# Patient Record
Sex: Male | Born: 1975 | Race: White | Hispanic: No | Marital: Married | Smoking: Never smoker
Health system: Southern US, Community
[De-identification: ages and names within clinical notes are randomized; demographics above are authoritative.]

## PROBLEM LIST (undated history)

## (undated) HISTORY — PX: NOSE SURGERY: SHX723

## (undated) HISTORY — PX: TONSILLECTOMY: SUR1361

---

## 2006-09-03 ENCOUNTER — Ambulatory Visit: Payer: Self-pay | Admitting: Emergency Medicine

## 2012-05-25 ENCOUNTER — Ambulatory Visit: Payer: Self-pay | Admitting: General Practice

## 2014-09-02 ENCOUNTER — Inpatient Hospital Stay: Admission: RE | Admit: 2014-09-02 | Payer: Self-pay | Source: Ambulatory Visit

## 2014-09-04 ENCOUNTER — Encounter: Payer: Self-pay | Admitting: *Deleted

## 2014-09-04 NOTE — Patient Instructions (Signed)
  Your procedure is scheduled on: 09-05-14 Report to [facility] 2ND FLOOR To find out your arrival time please call [phone] between 1PM - 3PM on 09-04-14  Remember: Instructions that are not followed completely may result in serious medical risk, up to and including death, or upon the discretion of your surgeon and anesthesiologist your surgery may need to be rescheduled.    _X___ 1. Do not eat food or drink liquids after midnight. No gum chewing or hard candies.     _X___ 2. No Alcohol for 24 hours before or after surgery.   ____ 3. Bring all medications with you on the day of surgery if instructed.    ____ 4. Notify your doctor if there is any change in your medical condition     (cold, fever, infections).     Do not wear jewelry, make-up, hairpins, clips or nail polish.  Do not wear lotions, powders, or perfumes. You may wear deodorant.  Do not shave 48 hours prior to surgery. Men may shave face and neck.  Do not bring valuables to the hospital.    [facility] is not responsible for any belongings or valuables.               Contacts, dentures or bridgework may not be worn into surgery.  Leave your suitcase in the car. After surgery it may be brought to your room.  For patients admitted to the hospital, discharge time is determined by your  treatment team.   Patients discharged the day of surgery will not be allowed to drive home.   Please read over the following fact sheets that you were given:      ____ Take these medicines the morning of surgery with A SIP OF WATER:    1. NONE  2.   3.   4.  5.  6.  ____ Fleet Enema (as directed)   ____ Use CHG Soap as directed  ____ Use inhalers on the day of surgery  ____ Stop metformin 2 days prior to surgery    ____ Take 1/2 of usual insulin dose the night before surgery and none on the morning of surgery.   ____ Stop Coumadin/Plavix/aspirin-N/A  ____ Stop Anti-inflammatories-NO NSAIDS OR ASA  PRODUCTS-TYLENOL OK   ____ Stop supplements until after surgery.    ____ Bring C-Pap to the hospital.

## 2014-09-05 ENCOUNTER — Encounter: Payer: Self-pay | Admitting: *Deleted

## 2014-09-05 ENCOUNTER — Ambulatory Visit
Admission: RE | Admit: 2014-09-05 | Discharge: 2014-09-05 | Disposition: A | Discharge status: Home / Self Care | Payer: PRIVATE HEALTH INSURANCE | Source: Ambulatory Visit | Attending: Specialist | Admitting: Specialist

## 2014-09-05 ENCOUNTER — Ambulatory Visit: Payer: PRIVATE HEALTH INSURANCE | Admitting: Anesthesiology

## 2014-09-05 ENCOUNTER — Encounter
Admission: RE | Disposition: A | Discharge status: Home / Self Care | Payer: Self-pay | Source: Ambulatory Visit | Attending: Specialist

## 2014-09-05 DIAGNOSIS — G5602 Carpal tunnel syndrome, left upper limb: Secondary | ICD-10-CM | POA: Insufficient documentation

## 2014-09-05 DIAGNOSIS — Z885 Allergy status to narcotic agent status: Secondary | ICD-10-CM | POA: Diagnosis not present

## 2014-09-05 DIAGNOSIS — G5601 Carpal tunnel syndrome, right upper limb: Secondary | ICD-10-CM | POA: Insufficient documentation

## 2014-09-05 DIAGNOSIS — Z79899 Other long term (current) drug therapy: Secondary | ICD-10-CM | POA: Insufficient documentation

## 2014-09-05 HISTORY — PX: BILATERAL CARPAL TUNNEL RELEASE: SHX6508

## 2014-09-05 SURGERY — BILATERAL CARPAL TUNNEL RELEASE
Anesthesia: General | Site: Wrist | Laterality: Bilateral | Wound class: Clean

## 2014-09-05 MED ORDER — MIDAZOLAM HCL 2 MG/2ML IJ SOLN
INTRAMUSCULAR | Status: DC | PRN
  Administered 2014-09-05: 2 mg via INTRAVENOUS

## 2014-09-05 MED ORDER — FENTANYL CITRATE (PF) 100 MCG/2ML IJ SOLN
25.0000 ug | INTRAMUSCULAR | Status: DC | PRN
  Administered 2014-09-05 (×2): 25 ug via INTRAVENOUS

## 2014-09-05 MED ORDER — TRAMADOL HCL 50 MG PO TABS: 50.0000 mg | ORAL_TABLET | Freq: Four times a day (QID) | ORAL | Status: DC | PRN

## 2014-09-05 MED ORDER — LACTATED RINGERS IV SOLN
INTRAVENOUS | Status: DC
  Administered 2014-09-05: 09:00:00 via INTRAVENOUS

## 2014-09-05 MED ORDER — PROMETHAZINE HCL 25 MG/ML IJ SOLN: 6.2500 mg | INTRAMUSCULAR | Status: DC | PRN

## 2014-09-05 MED ORDER — BUPIVACAINE HCL (PF) 0.5 % IJ SOLN
INTRAMUSCULAR | Status: DC | PRN
  Administered 2014-09-05: 8 mL

## 2014-09-05 MED ORDER — DEXAMETHASONE SODIUM PHOSPHATE 4 MG/ML IJ SOLN
INTRAMUSCULAR | Status: DC | PRN
  Administered 2014-09-05: 5 mg via INTRAVENOUS

## 2014-09-05 MED ORDER — CHLORHEXIDINE GLUCONATE 4 % EX LIQD: Freq: Once | CUTANEOUS | Status: DC

## 2014-09-05 MED ORDER — PROPOFOL 10 MG/ML IV BOLUS
INTRAVENOUS | Status: DC | PRN
  Administered 2014-09-05: 210 mg via INTRAVENOUS

## 2014-09-05 MED ORDER — FENTANYL CITRATE (PF) 100 MCG/2ML IJ SOLN
INTRAMUSCULAR | Status: AC
  Administered 2014-09-05: 25 ug via INTRAVENOUS
  Filled 2014-09-05: qty 2

## 2014-09-05 MED ORDER — ONDANSETRON HCL 4 MG/2ML IJ SOLN
INTRAMUSCULAR | Status: DC | PRN
  Administered 2014-09-05: 4 mg via INTRAVENOUS

## 2014-09-05 MED ORDER — FAMOTIDINE 20 MG PO TABS
20.0000 mg | ORAL_TABLET | Freq: Once | ORAL | Status: AC
  Administered 2014-09-05: 20 mg via ORAL

## 2014-09-05 MED ORDER — FAMOTIDINE 20 MG PO TABS
ORAL_TABLET | ORAL | Status: AC
Start: 2014-09-05 — End: 2014-09-05
  Administered 2014-09-05: 20 mg via ORAL
  Filled 2014-09-05: qty 1

## 2014-09-05 MED ORDER — FENTANYL CITRATE (PF) 100 MCG/2ML IJ SOLN
INTRAMUSCULAR | Status: DC | PRN
  Administered 2014-09-05: 25 ug via INTRAVENOUS
  Administered 2014-09-05: 100 ug via INTRAVENOUS
  Administered 2014-09-05: 25 ug via INTRAVENOUS

## 2014-09-05 MED ORDER — BUPIVACAINE HCL (PF) 0.5 % IJ SOLN
INTRAMUSCULAR | Status: AC
  Filled 2014-09-05: qty 30

## 2014-09-05 SURGICAL SUPPLY — 28 items
BANDAGE ELASTIC 3 CLIP NS LF (GAUZE/BANDAGES/DRESSINGS) ×6 IMPLANT
BANDAGE ELASTIC 3 CLIP ST LF (GAUZE/BANDAGES/DRESSINGS) ×3 IMPLANT
BLADE SURG MINI STRL (BLADE) IMPLANT
BNDG ESMARK 4X12 TAN STRL LF (GAUZE/BANDAGES/DRESSINGS) ×6 IMPLANT
CANISTER SUCT 1200ML W/VALVE (MISCELLANEOUS) ×3 IMPLANT
CHLORAPREP W/TINT 26ML (MISCELLANEOUS) ×63 IMPLANT
CUFF TOURN 18 STER (MISCELLANEOUS) ×3 IMPLANT
DRAPE EXTREMITY 106X87X128.5 (DRAPES) ×6 IMPLANT
DRAPE IMP U-DRAPE 54X76 (DRAPES) ×6 IMPLANT
DRAPE SHEET LG 3/4 BI-LAMINATE (DRAPES) ×3 IMPLANT
DRSG TEGADERM 2-3/8X2-3/4 SM (GAUZE/BANDAGES/DRESSINGS) ×3 IMPLANT
GAUZE PETRO XEROFOAM 1X8 (MISCELLANEOUS) ×3 IMPLANT
GAUZE SPONGE 4X4 12PLY STRL (GAUZE/BANDAGES/DRESSINGS) ×6 IMPLANT
GLOVE BIO SURGEON STRL SZ7.5 (GLOVE) ×3 IMPLANT
GOWN STRL REUS W/ TWL LRG LVL3 (GOWN DISPOSABLE) ×2 IMPLANT
GOWN STRL REUS W/TWL LRG LVL3 (GOWN DISPOSABLE) ×4
KIT RM TURNOVER STRD PROC AR (KITS) ×3 IMPLANT
NS IRRIG 500ML POUR BTL (IV SOLUTION) ×3 IMPLANT
PACK EXTREMITY ARMC (MISCELLANEOUS) ×3 IMPLANT
PAD GROUND ADULT SPLIT (MISCELLANEOUS) ×3 IMPLANT
PAD PREP 24X41 OB/GYN DISP (PERSONAL CARE ITEMS) ×6 IMPLANT
PADDING CAST 3IN STRL (MISCELLANEOUS) ×4
PADDING CAST BLEND 3X4 STRL (MISCELLANEOUS) ×2 IMPLANT
STOCKINETTE STRL 4IN 9604848 (GAUZE/BANDAGES/DRESSINGS) ×6 IMPLANT
SUT ETHILON 4-0 (SUTURE) ×4
SUT ETHILON 4-0 FS2 18XMFL BLK (SUTURE) ×2
SUTURE ETHLN 4-0 FS2 18XMF BLK (SUTURE) ×2 IMPLANT
SWABSTK COMLB BENZOIN TINCTURE (MISCELLANEOUS) ×3 IMPLANT

## 2014-09-05 NOTE — Brief Op Note (Signed)
28/06/2014  11:18 AM  PATIENT:  Maureen Ralphs  39 y.o. male  PRE-OPERATIVE DIAGNOSIS:  CARPAL TUNNEL SYNDROEM - BILATERAL  POST-OPERATIVE DIAGNOSIS:  CARPAL TUNNEL SYNDROEM - BILATERAL  PROCEDURE:  Procedure(s): BILATERAL CARPAL TUNNEL RELEASE (Bilateral)  SURGEON:  Surgeon(s) and Role:    * Myra Rude, MD - Primary  PHYSICIAN ASSISTANT:   ASSISTANTS: none   ANESTHESIA:   general  EBL:     BLOOD ADMINISTERED:none  DRAINS: none   LOCAL MEDICATIONS USED:  MARCAINE     SPECIMEN:  No Specimen  DISPOSITION OF SPECIMEN:  N/A  COUNTS:  YES  TOURNIQUET:    DICTATION: .Other Dictation: Dictation Number [id number]  PLAN OF CARE: Discharge to home after PACU  PATIENT DISPOSITION:  PACU - hemodynamically stable.   Delay start of Pharmacological VTE agent (>24hrs) due to surgical blood loss or risk of bleeding: not applicable

## 2014-09-05 NOTE — Anesthesia Postprocedure Evaluation (Signed)
  Anesthesia Post-op Note  Patient: Kyle Russell  Procedure(s) Performed: Procedure(s): BILATERAL CARPAL TUNNEL RELEASE (Bilateral)  Anesthesia type:General  Patient location: PACU  Post pain: Pain level controlled  Post assessment: Post-op Vital signs reviewed, Patient's Cardiovascular Status Stable, Respiratory Function Stable, Patent Airway and No signs of Nausea or vomiting  Post vital signs: Reviewed and stable  Last Vitals:  Filed Vitals:   09/05/14 1157  BP: 145/98  Pulse: 84  Temp:   Resp: 14    Level of consciousness: awake, alert  and patient cooperative  Complications: No apparent anesthesia complications

## 2014-09-05 NOTE — Discharge Instructions (Addendum)
Elevate hands as much as possible May remove all bandages in 24 hours and keep wounds clean and     Dry and covered with a BandaidAMBULATORY SURGERY  DISCHARGE INSTRUCTIONS   1) The drugs that you were given will stay in your system until tomorrow so for the next 24 hours you should not:  A) Drive an automobile B) Make any legal decisions C) Drink any alcoholic beverage   2) You may resume regular meals tomorrow.  Today it is better to start with liquids and gradually work up to solid foods.  You may eat anything you prefer, but it is better to start with liquids, then soup and crackers, and gradually work up to solid foods.   3) Please notify your doctor immediately if you have any unusual bleeding, trouble breathing, redness and pain at the surgery site, drainage, fever, or pain not relieved by medication.    4) Additional Instructions:        Please contact your physician with any problems or Same Day Surgery at [phone], Monday through Friday 6 am to 4 pm, or [place] at [facility] number at [phone].

## 2014-09-05 NOTE — Anesthesia Preprocedure Evaluation (Signed)
Anesthesia Evaluation  Patient identified by MRN, date of birth, ID band Patient awake    Reviewed: Allergy & Precautions, H&P , NPO status , Patient's Chart, lab work & pertinent test results, reviewed documented beta blocker date and time   History of Anesthesia Complications Negative for: history of anesthetic complications  Airway Mallampati: I  TM Distance: >3 FB Neck ROM: full    Dental no notable dental hx. (+) Missing   Pulmonary neg pulmonary ROS,  breath sounds clear to auscultation  Pulmonary exam normal       Cardiovascular Exercise Tolerance: Good negative cardio ROS Normal cardiovascular examRhythm:regular Rate:Normal     Neuro/Psych negative neurological ROS  negative psych ROS   GI/Hepatic negative GI ROS, Neg liver ROS,   Endo/Other  negative endocrine ROS  Renal/GU negative Renal ROS  negative genitourinary   Musculoskeletal   Abdominal   Peds  Hematology negative hematology ROS (+)   Anesthesia Other Findings History reviewed. No pertinent past medical history.   Reproductive/Obstetrics negative OB ROS                             Anesthesia Physical Anesthesia Plan  ASA: I  Anesthesia Plan: General   Post-op Pain Management:    Induction:   Airway Management Planned:   Additional Equipment:   Intra-op Plan:   Post-operative Plan:   Informed Consent: I have reviewed the patients History and Physical, chart, labs and discussed the procedure including the risks, benefits and alternatives for the proposed anesthesia with the patient or authorized representative who has indicated his/her understanding and acceptance.   Dental Advisory Given  Plan Discussed with: Anesthesiologist, CRNA and Surgeon  Anesthesia Plan Comments:         Anesthesia Quick Evaluation

## 2014-09-05 NOTE — OR Nursing (Signed)
sm scratches noted on both hand and wrist upon arrival to preop

## 2014-09-05 NOTE — H&P (Signed)
  39 year old male with bilateral carpal tunnel syndrome. History and physical exam has been inserted into the chart in the form of a paper document which also includes his most recent office note. Heart and lungs clear. ENT normal Plan: bilateral carpal tunnel release

## 2014-09-05 NOTE — Anesthesia Procedure Notes (Signed)
Procedure Name: LMA Insertion Date/Time: 09/05/2014 10:14 AM Performed by: Derinda Late Pre-anesthesia Checklist: Timeout performed, Patient being monitored, Suction available, Emergency Drugs available and Patient identified Patient Re-evaluated:Patient Re-evaluated prior to inductionOxygen Delivery Method: Circle system utilized Intubation Type: IV induction Ventilation: Mask ventilation without difficulty Placement Confirmation: positive ETCO2 and breath sounds checked- equal and bilateral

## 2014-09-05 NOTE — Transfer of Care (Signed)
Immediate Anesthesia Transfer of Care Note  Patient: Kyle Russell  Procedure(s) Performed: Procedure(s): BILATERAL CARPAL TUNNEL RELEASE (Bilateral)  Patient Location: PACU  Anesthesia Type:General  Level of Consciousness: awake, alert , oriented and patient cooperative  Airway & Oxygen Therapy: Patient Spontanous Breathing and Patient connected to face mask oxygen  Post-op Assessment: Report given to RN and Post -op Vital signs reviewed and stable  Post vital signs: Reviewed and stable  Last Vitals:  Filed Vitals:   09/05/14 1117  BP: 144/88  Pulse: 104  Temp: 36.3 C  Resp: 16    Complications: No apparent anesthesia complications

## 2014-09-06 NOTE — Op Note (Signed)
NAMEFOREST, REDWINE NO.:  [id number]  MEDICAL RECORD NO.:  [id number]  LOCATION:  [facility]                         FACILITY:  [facility]  PHYSICIAN:  Reita Chard, MD        DATE OF BIRTH:  07-31-1975  DATE OF PROCEDURE:  09/05/2014 DATE OF DISCHARGE:  09/05/2014                              OPERATIVE REPORT   PREOPERATIVE DIAGNOSIS:  Bilateral carpal tunnel syndrome.  POSTOPERATIVE DIAGNOSIS:  Bilateral carpal tunnel syndrome.  PROCEDURE: 1. Right carpal tunnel release. 2. Left carpal tunnel release.  SURGEON:  Reita Chard, M.D.  ANESTHESIA:  General.  COMPLICATIONS:  None.  TOURNIQUET TIME:  13 minutes on the right and 14 minutes on the left.  DESCRIPTION OF PROCEDURE:  After adequate induction of general anesthesia, the upper extremities are thoroughly prepped with alcohol and ChloraPrep and draped in standard sterile fashion.  Identical procedures are performed on each side.  On the right side, a sterile tourniquet was used because of an IV line in the antecubital fossa.  The extremity is wrapped out with the Esmarch bandage and pneumatic tourniquet elevated on the right to 150 mmHg and on the left 250 mmHg. Under loupe magnification, standard volar carpal tunnel incision was made.  Dissection carried down to the transverse carpal ligament.  The ligament on each side is incised in the midportion using the knife.  The distal release is performed with the small scissors.  The proximal release is performed with the small scissors and the carpal tunnel scissors.  On each side, there seem to be moderate-to-severe compression of the median nerve.  Careful check is made both proximally and distally to ensure that complete release had been obtained.  The wounds were thoroughly irrigated multiple times.  Skin edges are infiltrated with 0.5% plain Marcaine.  Skin was closed with 4-0 nylon.  Soft bulky dressing is applied.  Tourniquet is released.  The patient is  returned to the recovery room in satisfactory condition, having tolerated the procedure quite well.          ______________________________ Reita Chard, MD    CS/MEDQ  D:  09/06/2014  T:  09/06/2014  Job:  [id number]

## 2015-06-01 ENCOUNTER — Other Ambulatory Visit: Payer: Self-pay | Admitting: Obstetrics & Gynecology

## 2015-06-01 ENCOUNTER — Ambulatory Visit
Admission: RE | Admit: 2015-06-01 | Discharge: 2015-06-01 | Disposition: A | Discharge status: Home / Self Care | Payer: 59 | Source: Ambulatory Visit | Attending: Obstetrics & Gynecology | Admitting: Obstetrics & Gynecology

## 2015-06-01 DIAGNOSIS — Z31440 Encounter of male for testing for genetic disease carrier status for procreative management: Secondary | ICD-10-CM

## 2015-06-01 NOTE — Progress Notes (Signed)
Alben SpittleBradley Merrow was seen today in [facility] [place] for a lab only visit.  Blood was drawn to send out for genotyping for E/e to determine risks to their current pregnancy.  The sample will be sent to [facility] who will ship it to [facility] for genotyping.  Results will be called directly to [facility] when they become available.  Cherly Andersoneborah F. Gerardo Territo, MS, CGC

## 2015-06-06 LAB — MISC LABCORP TEST (SEND OUT): Labcorp test code: 4465

## 2015-06-08 ENCOUNTER — Telehealth: Payer: Self-pay | Admitting: Obstetrics and Gynecology

## 2015-06-08 NOTE — Telephone Encounter (Signed)
Mr. 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  His wife has isoimmunization to E and this testing was indicated to determine the risks for hemolytic disease of the newborn for the pregnancy his wife is currently carrying.  We reviewed that there is a 50% chance for the baby to inherit the E gene and be at risk for hemolytic disease of the newborn. There is also a 50% chance that the baby would inherit the e gene and not be at risk. Amniocentesis was offered to determine the fetal genotype. Ms. Ivor CostaRoessler declined this procedure due to the risk. Dr. Leone BrandGrotegut outlined the plan to proceed as previously recommended with monthly titers. If the titer reaches 1:16, then the patient should be scheduled back in the [facility] for MCA doppler studies and management as indicated.  Please contact our office at [phone] with any questions or concerns.   Cherly Andersoneborah F. Mattingly Fountaine, MS, CGC

## 2016-05-12 ENCOUNTER — Ambulatory Visit: Payer: Self-pay | Admitting: Physician Assistant

## 2017-07-31 ENCOUNTER — Ambulatory Visit
Admission: RE | Admit: 2017-07-31 | Discharge: 2017-07-31 | Disposition: A | Discharge status: Home / Self Care | Payer: Managed Care, Other (non HMO) | Source: Ambulatory Visit | Attending: Family Medicine | Admitting: Family Medicine

## 2017-07-31 ENCOUNTER — Ambulatory Visit: Payer: Self-pay | Admitting: Family Medicine

## 2017-07-31 VITALS — BP 169/101 | HR 96 | Resp 16

## 2017-07-31 DIAGNOSIS — M25552 Pain in left hip: Secondary | ICD-10-CM

## 2017-07-31 NOTE — Progress Notes (Signed)
Subjective: left hip pain    Kyle Russell is a 42 y.o. male who presents with left postero-lateral hip pain. Onset of the symptoms was 3 years ago.  Patient reports daily pain for the last 3 years that has been slowly increasing in severity.  Inciting event: none.  Denies injury, surgery, or trauma.  Patient reports the pain radiates to his left lateral thigh but stops just proximal to the left knee.  The patient reports the hip pain is constant and patient denies any known alleviating or aggravating factors. Patient has had no prior hip problems. Previous visits for this problem: none. Evaluation to date: none. Treatment to date: Avoidance of painful activities, rest, ice, heat, stretches, and yoga without any improvement.  Patient believes pain originates from his hip.  Patient endorses mild stiffness with inactivity that last a few minutes and resolves.  Patient endorses decreased strength when raising his left leg, which he believes is due to avoiding using his left leg and hip due to pain.  Patient denies numbness, tingling, spasticity, paralysis, saddle anesthesia, bowel/bladder retention or dysfunction. Denies any neurologic symptoms or altered sensation.  Patient spends most the day seated at work.  Patient reports pain is currently 2 out of 10 but that midday his pain level often reaches 10 out of 10 pain.  Review of Systems Pertinent items noted in HPI and remainder of comprehensive ROS otherwise negative.   Objective:   General appearance: alert, cooperative, appears stated age and no distress Extremities: extremities normal, atraumatic, no cyanosis or edema Pulses: 2+ and symmetric Skin: Skin color, texture, turgor normal. No rashes or lesions Neurologic: Grossly normal Right hip: normal  Left hip: FROM. Normal strength and sensation.  No deformity, swelling, ecchymosis, edema, erythema, or warmth to the touch. Pain with internal/external rotation of left hip.  Generalized  postero-lateral hip TTP.  Spine: Full range of motion without pain, no tenderness, no spasm, no curvature.  Normal alignment.  Negative straight leg raise bilaterally.  Normal gait and strength.   Imaging: EXAM: DG HIP (WITH OR WITHOUT PELVIS) 2-3V LEFT  COMPARISON:  None.  FINDINGS: There is no evidence of hip fracture or dislocation. No evidence of hip joint arthropathy. Sclerosis is seen involving the pubic symphysis, consistent with degenerative changes.  IMPRESSION: No radiographic abnormality of left hip.  Pubic symphysis degenerative changes.   Electronically Signed   By: Kyle RosenthalJohn  Russell M.D.   On: 07/31/2017 11:08   Assessment:   Left hip pain  Plan:   Discussed ice/heat application.  Continue stretches at home.  Called and spoke with the patient over the phone regarding his x-ray results. Advised the patient to follow-up with his primary care provider regarding his elevated blood pressure within the next 2 days.  Discussed normal values. Patient reports he is unable to tolerate NSAIDs. Discussed the option to continue conservative treatment, physical therapy, or to refer him to orthopedics.  Patient would like an orthopedic referral due to the long duration of his pain and lack of improvement with conservative measures attempted so far. Discussed red flag symptoms and indications to seek medical care.

## 2017-08-11 ENCOUNTER — Ambulatory Visit (INDEPENDENT_AMBULATORY_CARE_PROVIDER_SITE_OTHER): Payer: Managed Care, Other (non HMO)

## 2017-08-11 ENCOUNTER — Encounter (INDEPENDENT_AMBULATORY_CARE_PROVIDER_SITE_OTHER): Payer: Self-pay | Admitting: Orthopaedic Surgery

## 2017-08-11 ENCOUNTER — Ambulatory Visit (INDEPENDENT_AMBULATORY_CARE_PROVIDER_SITE_OTHER): Payer: Managed Care, Other (non HMO) | Admitting: Orthopaedic Surgery

## 2017-08-11 VITALS — BP 156/109 | HR 89 | Ht 72.0 in | Wt 275.0 lb

## 2017-08-11 DIAGNOSIS — G8929 Other chronic pain: Secondary | ICD-10-CM

## 2017-08-11 DIAGNOSIS — M5442 Lumbago with sciatica, left side: Secondary | ICD-10-CM

## 2017-08-11 NOTE — Progress Notes (Signed)
Office Visit Note   Patient: Kyle Russell           Date of Birth: January 13, 1976           MRN: [id number] Visit Date: 08/11/2017              Requested by: Frances Maywood, FNP [address] [place], [place] [address] PCP: Patient, No Pcp Per   Assessment & Plan: Visit Diagnoses:  1. Chronic midline low back pain with left-sided sciatica     Plan: 3-year history of persistent low back pain localized to the left side with referred pain into the posterior left thigh.  I think an MRI scan is certainly indicated given the chronicity of his pain and poor response to over-the-counter medicines and exercises.  Does have some radicular pain into his left leg  Follow-Up Instructions: No follow-ups on file.   Orders:  Orders Placed This Encounter  Procedures  . XR Lumbar Spine 2-3 Views   No orders of the defined types were placed in this encounter.     Procedures: No procedures performed   Clinical Data: No additional findings.   Subjective: Chief Complaint  Patient presents with  . Follow-up    l hip pain for 3 yrs just getting worse has some numbness that radiates to the left knee more in the evening  42 year old gentleman noted insidious onset of left-sided low back and posterior left thigh pain approximately 3 years ago.  No history of injury or trauma.  Denies any bowel or bladder dysfunction.  Has been evaluated by his primary care physician with films of the pelvis negative for any obvious pathology.  Kyle Russell relates that he seems to have more problem with pain on the left side of his back referred to his left buttock and the posterior aspect of his left thigh gets up from a sitting position as he walks any distance it seems to improve.  Not had any right-sided symptoms.  He does not have any pain distal to the left knee.  He has not had any numbness or tingling.  No prior injury.  No prior surgery.  No family history of back problems. I did review the  films of his pelvis and on the PACS system.  No evidence of osteoarthritis or pathology about the left hemipelvis He has tried a number of over-the-counter medicines which are ineffective HPI  Review of Systems  Constitutional: Negative for fatigue and fever.  HENT: Negative for ear pain.   Eyes: Negative for pain.  Respiratory: Negative for cough and shortness of breath.   Cardiovascular: Negative for leg swelling.  Gastrointestinal: Negative for constipation and diarrhea.  Genitourinary: Negative for difficulty urinating.  Musculoskeletal: Negative for back pain and neck pain.  Skin: Negative for rash.  Allergic/Immunologic: Negative for food allergies.  Neurological: Positive for weakness and numbness.  Hematological: Does not bruise/bleed easily.  Psychiatric/Behavioral: Positive for sleep disturbance.     Objective: Vital Signs: BP (!) 156/109 (BP Location: Left Arm, Patient Position: Sitting, Cuff Size: Normal)   Pulse 89   Ht 6' (1.829 m)   Wt 275 lb (124.7 kg)   BMI 37.30 kg/m   Physical Exam  Constitutional: He is oriented to person, place, and time. He appears well-developed and well-nourished.  HENT:  Mouth/Throat: Oropharynx is clear and moist.  Eyes: Pupils are equal, round, and reactive to light. EOM are normal.  Pulmonary/Chest: Effort normal.  Neurological: He is alert and oriented to person,  place, and time.  Skin: Skin is warm and dry.  Psychiatric: He has a normal mood and affect. His behavior is normal.    Ortho Exam awake alert and oriented x3.  Comfortable sitting.  Leg raise negative on the right.  Positive on the left at about 85 to 90 degrees for left-sided low back pain.  Reflexes symmetrical.  Motor and sensory exam both hips and both knees.  No distal edema.  Neurovascular exam intact pelvis level.  Specialty Comments:  No specialty comments available.  Imaging: No results found.   PMFS History: There are no active problems to display for  this patient.  History reviewed. No pertinent past medical history.  History reviewed. No pertinent family history.  Past Surgical History:  Procedure Laterality Date  . BILATERAL CARPAL TUNNEL RELEASE Bilateral 09/05/2014   Procedure: BILATERAL CARPAL TUNNEL RELEASE;  Surgeon: Myra Rudehristopher Smith, MD;  Location: [facility] ORS;  Service: Orthopedics;  Laterality: Bilateral;  . NOSE SURGERY    . TONSILLECTOMY     Social History   Occupational History  . Not on file  Tobacco Use  . Smoking status: Never Smoker  . Smokeless tobacco: Never Used  Substance and Sexual Activity  . Alcohol use: No  . Drug use: No  . Sexual activity: Not on file

## 2017-08-20 ENCOUNTER — Other Ambulatory Visit: Payer: Self-pay

## 2017-08-25 ENCOUNTER — Ambulatory Visit (INDEPENDENT_AMBULATORY_CARE_PROVIDER_SITE_OTHER): Payer: Managed Care, Other (non HMO) | Admitting: Orthopaedic Surgery

## 2018-01-08 ENCOUNTER — Ambulatory Visit: Payer: Self-pay | Admitting: Emergency Medicine

## 2018-01-09 ENCOUNTER — Encounter: Payer: Self-pay | Admitting: Emergency Medicine

## 2018-01-09 ENCOUNTER — Ambulatory Visit: Payer: Self-pay | Admitting: Emergency Medicine

## 2018-01-09 VITALS — BP 142/90 | HR 90 | Temp 98.1°F | Resp 14 | Ht 72.0 in | Wt 270.0 lb

## 2018-01-09 DIAGNOSIS — E669 Obesity, unspecified: Secondary | ICD-10-CM | POA: Insufficient documentation

## 2018-01-09 DIAGNOSIS — J208 Acute bronchitis due to other specified organisms: Secondary | ICD-10-CM

## 2018-01-09 MED ORDER — PREDNISONE 50 MG PO TABS: ORAL_TABLET | ORAL | 0 refills | Status: DC

## 2018-01-09 MED ORDER — BENZONATATE 200 MG PO CAPS: ORAL_CAPSULE | ORAL | 0 refills | Status: DC

## 2018-01-09 NOTE — Progress Notes (Signed)
Patient ID: Kyle RalphsBradley S Russell, male   DOB: 07/24/1975, 42 y.o.   MRN: [id number]  About 10 days ago, acute onset fever chills and severe sinus congestion and cough.  Never had discolored rhinorrhea or sputum.  He is progressively feeling a little better every day, and fever has resolved 7 days ago.  But still with nonproductive cough and sinus congestion and clearish rhinorrhea. No history of chronic lung disease.  Tried OTC meds without significant relief.  Symptoms:   + URI prodrome with nasal congestion + Minimal swollen neck glands + No sinus Headache + mild ear pressure  No Allergy symptoms No significant Sore Throat No eye symptoms     No chest pain No shortness of breath  No wheezing  No Abdominal Pain No Nausea No Vomiting No diarrhea  No Myalgias No focal neurologic symptoms No syncope No Rash  No Urinary symptoms  Remainder of Review of Systems negative except as noted in the HPI.      Blood pressure (!) 142/90, pulse 90, temperature 98.1 F (36.7 C), temperature source Oral, resp. rate 14, height 6' (1.829 m), weight 270 lb (122.5 kg), SpO2 97 %.   PHYSICAL EXAM Constitutional: Oriented to person, place, and time.  Appears well-developed and well-nourished. No distress.  Occasional nonproductive cough noted HENT:  Head: Normocephalic and atraumatic.  Right Ear: Tympanic membrane, external ear and ear canal normal.  Left Ear: Tympanic membrane, external ear and ear canal normal.  Nose: Mucosal edema and rhinorrhea present. Right sinus exhibits minimal maxillary sinus tenderness. Left sinus exhibits minimal maxillary sinus tenderness.  Mouth/Throat: Oropharynx is clear and moist. No oral lesions. No oropharyngeal exudate.  Eyes: Right eye exhibits no discharge. Left eye exhibits no discharge. No scleral icterus.  Neck: Neck supple.  No adenopathy. Cardiovascular: Normal rate, regular rhythm and normal heart sounds.  Pulmonary/Chest: Effort normal. No  respiratory distress.  Minimal rhonchi present, anterior lung fields No wheezing. No rales. Neurological: Alert and oriented to person, place, and time.  Cranial nerves intact. Skin: Skin is warm and dry. No rash noted.  Nursing note and vitals reviewed.   Assessment: Likely had acute viral syndrome 10 days ago, with some mild persistent viral symptoms of sinus and bronchitis inflammation.  No fever or discolored rhinorrhea or sputum.  No findings to suggest bacterial cause at this point.   Plan: Treatment options discussed, as well as risks, benefits, alternatives. I explained that antibiotics not indicated at this point, and he voiced understanding and agreement.  Will treat with symptomatic care, Tessalon Perles.  Tylenol or ibuprofen as needed. Because of the persistent cough and congestion, likely inflammatory, will treat with short oral prednisone burst. Patient voiced understanding and agreement with the following plans:  New Prescriptions   BENZONATATE (TESSALON) 200 MG CAPSULE    Take 1 every 8 hours as needed for cough.   PREDNISONE (DELTASONE) 50 MG TABLET    Take 1 daily with a meal for 5 days    Follow-up with your primary care doctor in 5-7 days if not improving, or sooner if symptoms become worse. Precautions discussed. Red flags discussed. Questions invited and answered. Patient voiced understanding and agreement.

## 2018-03-28 ENCOUNTER — Ambulatory Visit: Payer: Self-pay | Admitting: Adult Health

## 2018-03-28 ENCOUNTER — Ambulatory Visit
Admission: RE | Admit: 2018-03-28 | Discharge: 2018-03-28 | Disposition: A | Discharge status: Home / Self Care | Payer: Managed Care, Other (non HMO) | Source: Ambulatory Visit | Attending: Adult Health | Admitting: Adult Health

## 2018-03-28 ENCOUNTER — Telehealth: Payer: Self-pay | Admitting: Adult Health

## 2018-03-28 ENCOUNTER — Ambulatory Visit
Admission: RE | Admit: 2018-03-28 | Discharge: 2018-03-28 | Disposition: A | Discharge status: Home / Self Care | Payer: Managed Care, Other (non HMO) | Attending: Adult Health | Admitting: Adult Health

## 2018-03-28 VITALS — BP 132/86 | HR 80 | Temp 98.1°F | Resp 16

## 2018-03-28 DIAGNOSIS — M79644 Pain in right finger(s): Secondary | ICD-10-CM | POA: Insufficient documentation

## 2018-03-28 NOTE — Telephone Encounter (Signed)
03/29/2018 9:09 am results given as below. He will follow up with [facility] if symptoms persist after recommended treatment.  He will return to the office in 1 week for recheck of his finger after following care advice given.  Trial of NSAIDs and immobilization.  Flag symptoms have been discussed and patient will seek treatment if any other symptoms should occur.  Patient verbalized understanding of all instructions given and denies any further questions at this time.     CLINICAL DATA:  Right middle finger pain.  EXAM: RIGHT HAND - COMPLETE 3+ VIEW  COMPARISON:  None.  FINDINGS: There is no evidence of fracture or dislocation. There is no evidence of arthropathy. Pellet is noted in distal fifth metacarpal consistent with old injury. Soft tissues are unremarkable.  IMPRESSION: No acute abnormality seen in the right hand.   Electronically Signed   By: Lupita Raider, M.D.   On: 03/28/2018 12:25
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
## 2018-03-28 NOTE — Patient Instructions (Signed)
Ibuprofen 800 mg every 8 hours as needed for pain PRN for one week. Follow up with [facility] if not improving or resolved by one week and follow up if any symptoms are worsening any from today's visit.   Recommend finger splint to immobilize middle right finger.   Rest and elevate the affected painful area.  Apply cold compresses intermittently as needed.  As pain recedes, begin normal activities slowly as tolerated.  Call if symptoms persist.  Ibuprofen tablets and capsules What is this medicine? IBUPROFEN (eye BYOO proe fen) is a non-steroidal anti-inflammatory drug (NSAID). It is used for dental pain, fever, headaches or migraines, osteoarthritis, rheumatoid arthritis, or painful monthly periods. It can also relieve minor aches and pains caused by a cold, flu, or sore throat. This medicine may be used for other purposes; ask your health care provider or pharmacist if you have questions. COMMON BRAND NAME(S): Advil, Advil Junior Strength, Advil Migraine, Genpril, Ibren, IBU, Midol, Midol Cramps and Body Aches, Motrin, Motrin IB, Motrin Junior Strength, Motrin Migraine Pain, Samson-8, Toxicology Saliva Collection What should I tell my health care provider before I take this medicine? They need to know if you have any of these conditions: -cigarette smoker -coronary artery bypass graft (CABG) surgery within the past 2 weeks -drink more than 3 alcohol-containing drinks a day -heart disease -high blood pressure -history of stomach bleeding -kidney disease -liver disease -lung or breathing disease, like asthma -an unusual or allergic reaction to ibuprofen, aspirin, other NSAIDs, other medicines, foods, dyes, or preservatives -pregnant or trying to get pregnant -breast-feeding How should I use this medicine? Take this medicine by mouth with a glass of water. Follow the directions on the prescription label. Take this medicine with food if your stomach gets upset. Try to not lie down  for at least 10 minutes after you take the medicine. Take your medicine at regular intervals. Do not take your medicine more often than directed. A special MedGuide will be given to you by the pharmacist with each prescription and refill. Be sure to read this information carefully each time. Talk to your pediatrician regarding the use of this medicine in children. Special care may be needed. Overdosage: If you think you have taken too much of this medicine contact a poison control center or emergency room at once. NOTE: This medicine is only for you. Do not share this medicine with others. What if I miss a dose? If you miss a dose, take it as soon as you can. If it is almost time for your next dose, take only that dose. Do not take double or extra doses. What may interact with this medicine? Do not take this medicine with any of the following medications: -cidofovir -ketorolac -methotrexate -pemetrexed This medicine may also interact with the following medications: -alcohol -aspirin -diuretics -lithium -other drugs for inflammation like prednisone -warfarin This list may not describe all possible interactions. Give your health care provider a list of all the medicines, herbs, non-prescription drugs, or dietary supplements you use. Also tell them if you smoke, drink alcohol, or use illegal drugs. Some items may interact with your medicine. What should I watch for while using this medicine? Tell your doctor or healthcare professional if your symptoms do not start to get better or if they get worse. This medicine does not prevent heart attack or stroke. In fact, this medicine may increase the chance of a heart attack or stroke. The chance may increase with longer use of this medicine  and in people who have heart disease. If you take aspirin to prevent heart attack or stroke, talk with your doctor or health care professional. Do not take other medicines that contain aspirin, ibuprofen, or  naproxen with this medicine. Side effects such as stomach upset, nausea, or ulcers may be more likely to occur. Many medicines available without a prescription should not be taken with this medicine. This medicine can cause ulcers and bleeding in the stomach and intestines at any time during treatment. Ulcers and bleeding can happen without warning symptoms and can cause death. To reduce your risk, do not smoke cigarettes or drink alcohol while you are taking this medicine. You may get drowsy or dizzy. Do not drive, use machinery, or do anything that needs mental alertness until you know how this medicine affects you. Do not stand or sit up quickly, especially if you are an older patient. This reduces the risk of dizzy or fainting spells. This medicine can cause you to bleed more easily. Try to avoid damage to your teeth and gums when you brush or floss your teeth. This medicine may be used to treat migraines. If you take migraine medicines for 10 or more days a month, your migraines may get worse. Keep a diary of headache days and medicine use. Contact your healthcare professional if your migraine attacks occur more frequently. What side effects may I notice from receiving this medicine? Side effects that you should report to your doctor or health care professional as soon as possible: -allergic reactions like skin rash, itching or hives, swelling of the face, lips, or tongue -severe stomach pain -signs and symptoms of bleeding such as bloody or black, tarry stools; red or dark-brown urine; spitting up blood or brown material that looks like coffee grounds; red spots on the skin; unusual bruising or bleeding from the eye, gums, or nose -signs and symptoms of a blood clot such as changes in vision; chest pain; severe, sudden headache; trouble speaking; sudden numbness or weakness of the face, arm, or leg -unexplained weight gain or swelling -unusually weak or tired -yellowing of eyes or skin Side  effects that usually do not require medical attention (report to your doctor or health care professional if they continue or are bothersome): -bruising -diarrhea -dizziness, drowsiness -headache -nausea, vomiting This list may not describe all possible side effects. Call your doctor for medical advice about side effects. You may report side effects to FDA at 1-800-FDA-1088. Where should I keep my medicine? Keep out of the reach of children. Store at room temperature between 15 and 30 degrees C (59 and 86 degrees F). Keep container tightly closed. Throw away any unused medicine after the expiration date. NOTE: This sheet is a summary. It may not cover all possible information. If you have questions about this medicine, talk to your doctor, pharmacist, or health care provider.  2019 Elsevier/Gold Standard (2016-09-21 12:43:57) Finger Sprain, Adult A finger sprain is a tear or stretch in a ligament in your finger. Ligaments are tissues that connect bones to each other. Follow these instructions at home: If you have a splint:   Do not put pressure on any part of the splint until it is fully hardened. This may take many hours.  Wear the splint as told by your doctor. Take it off only as told by your doctor.  Loosen the splint if your fingers tingle, lose feeling (get numb), or turn cold and blue.  Keep the splint clean.  If the splint is  not waterproof: ? Do not let it get wet. ? Cover it with a watertight covering when you take a bath or a shower. If you have a cast:  Do not put pressure on any part of the cast until it is fully hardened. This may take many hours.  Do not stick anything inside the cast to scratch your skin.  Check the skin around the cast every day. Tell your doctor about any concerns.  You may put lotion on dry skin around the edges of the cast. Do not put lotion on the skin under the cast.  Keep the cast clean.  If the cast is not waterproof: ? Do not let it  get wet. ? Cover it with a watertight covering when you take a bath or a shower. Managing pain, stiffness, and swelling  If directed, put ice on the injured area: ? If you have a removable splint, take it off as told by your doctor. ? Put ice in a plastic bag. ? Place a towel between your skin and the bag or between your cast and the bag. ? Leave the ice on for 20 minutes, 2-3 times a day.  Gently move your fingers often to avoid stiffness and to lessen swelling.  Raise (elevate) the injured area above the level of your heart while you are sitting or lying down. Medicines  Take over-the-counter and prescription medicines only as told by your doctor.  Do not drive or use heavy machinery while taking prescription pain medicine. General instructions  Keep any bandages (dressings) dry until your doctor says they can be taken off.  Do exercises as told by your doctor or physical therapist.  Do not wear rings on your injured finger.  Keep all follow-up visits as told by your doctor. This is important. Get help right away if:  Your pain is not helped by medicine.  Your bruising or swelling gets worse.  Your splint or cast is damaged.  You lose feeling in your finger.  Your finger turns blue.  Your finger feels colder than normal when you touch it.  You have a fever. Summary  A finger sprain is a tear or stretch in a ligament in your finger. Ligaments are tissues that connect bones to each other.  If you have a splint, loosen the splint if your fingers tingle, lose feeling (get numb), or turn cold and blue.  Gently move your fingers often to avoid stiffness and to lessen swelling.  If directed, put ice on the injured area. This information is not intended to replace advice given to you by your health care provider. Make sure you discuss any questions you have with your health care provider. Document Released: 02/19/2010 Document Revised: 04/08/2016 Document Reviewed:  04/08/2016 Elsevier Interactive Patient Education  2019 [employer].

## 2018-03-29 NOTE — Progress Notes (Signed)
Patient was called with results and telephone call documented.

## 2018-06-07 ENCOUNTER — Telehealth: Payer: Self-pay

## 2018-06-07 NOTE — Telephone Encounter (Signed)
The patient called requesting to have a Virtual Visit at the clinic for severe back pain. He advised he had been seen at the clinic for the same issue a year ago but this is worse and can be debilitating. He has an appointment with [facility] Tuesday 06/12/18 but was in pain and wanted to see if something could be prescribed to help with the pain until his appointment. He was advised that an Office Visit today would be needed and informed that due to his pain level he may want to go to the walk in at [facility] in [place]. The patient gave verbal understanding.

## 2018-06-12 ENCOUNTER — Encounter: Payer: Self-pay | Admitting: Orthopaedic Surgery

## 2018-06-12 ENCOUNTER — Other Ambulatory Visit: Payer: Self-pay

## 2018-06-12 ENCOUNTER — Ambulatory Visit: Payer: Managed Care, Other (non HMO) | Admitting: Orthopaedic Surgery

## 2018-06-12 VITALS — BP 160/104 | HR 89 | Ht 72.0 in | Wt 280.0 lb

## 2018-06-12 DIAGNOSIS — G8929 Other chronic pain: Secondary | ICD-10-CM | POA: Diagnosis not present

## 2018-06-12 DIAGNOSIS — M5442 Lumbago with sciatica, left side: Secondary | ICD-10-CM | POA: Diagnosis not present

## 2018-06-12 NOTE — Progress Notes (Signed)
Office Visit Note   Patient: Kyle Russell           Date of Birth: 03-06-1975           MRN: [id number] Visit Date: 06/12/2018              Requested by: No referring provider defined for this encounter. PCP: Patient, No Pcp Per   Assessment & Plan: Visit Diagnoses:  1. Chronic left-sided low back pain with left-sided sciatica     Plan:  #1:  MRI scan of the lumbar spine to rule out HNP.  Certainly is continued to have his symptoms and since they had denied it previously he has seen a chiropractor and has done exercises which have been futile at this time.  Because his pain is certainly persistent and radicular an MRI scan would be indicated.  Follow-Up Instructions: Return in about 2 weeks (around 06/26/2018) for review of mri.   Face-to-face time spent with patient was greater than 30 minutes.  Greater than 50% of the time was spent in counseling and coordination of care.    Orders:  Orders Placed This Encounter  Procedures  . MR Lumbar Spine w/o contrast   No orders of the defined types were placed in this encounter.     Procedures: No procedures performed   Clinical Data: No additional findings.   Subjective: Chief Complaint  Patient presents with  . Lower Back - Pain   HPI: Patient presents today for lower back pain X3 months. No injury. The pain is located on the left lower side and radiates down the posterior leg and into his knee. Some numbness and tingling in his left lower extremity. He is taking Motrin and tylenol for pain. He has noticed that his left leg feels weak. The pain gets better after a prolonged position, but gets worse when he changes position. He has a history of the same pain last year. He saw Dr.Whitfield last year for lower back pain. His MRI was denied and he never did the PT, but the pain went away until recently.  He has had re-exacerbation of the pain despite his conservative treatment.  He is especially painful in the morning upon  rising.  Pain to improve also.  He does have pain in the posterior aspect of his leg which is radicular in nature.  He has tried Land, inversion table, and a home exercise program.   Review of Systems  Constitutional: Negative for fatigue.  HENT: Negative for ear pain.   Eyes: Negative for pain.  Respiratory: Negative for shortness of breath.   Cardiovascular: Negative for leg swelling.  Gastrointestinal: Negative for constipation and diarrhea.  Endocrine: Negative for cold intolerance and heat intolerance.  Genitourinary: Negative for difficulty urinating.  Musculoskeletal: Negative for joint swelling.  Skin: Negative for rash.  Allergic/Immunologic: Negative for food allergies.  Neurological: Positive for weakness.  Hematological: Does not bruise/bleed easily.  Psychiatric/Behavioral: Positive for sleep disturbance.     Objective: Vital Signs: BP (!) 160/104   Pulse 89   Ht 6' (1.829 m)   Wt 280 lb (127 kg)   BMI 37.97 kg/m   Physical Exam Constitutional:      Appearance: Normal appearance. He is well-developed.  HENT:     Head: Normocephalic.  Eyes:     Pupils: Pupils are equal, round, and reactive to light.  Pulmonary:     Effort: Pulmonary effort is normal.  Skin:    General: Skin is warm and  dry.  Neurological:     Mental Status: He is alert and oriented to person, place, and time.  Psychiatric:        Behavior: Behavior normal.     Ortho Exam  Exam today reveals some tenderness to palpation over the left lumbar spine and SI joint.  Has a positive straight leg raising bilaterally at around 70  degrees.  He remains with good strength in the lower extremities bilateral symmetric.  Deep tendon reflexes were 2+ bilateral symmetric.  Sensation is intact light touch.  Good motion of his hips pain.  Specialty Comments:  No specialty comments available.  Imaging: No results found.   PMFS History: Current Outpatient Medications  Medication Sig Dispense  Refill  . acetaminophen (TYLENOL) 325 MG tablet Take 650 mg by mouth every 6 (six) hours as needed.    Marland Kitchen. ibuprofen (ADVIL) 200 MG tablet Take 200 mg by mouth every 6 (six) hours as needed.    . Melatonin 2.5 MG CHEW Chew by mouth.     No current facility-administered medications for this visit.     Patient Active Problem List   Diagnosis Date Noted  . Obesity (BMI 30-39.9) 01/09/2018   History reviewed. No pertinent past medical history.  History reviewed. No pertinent family history.  Past Surgical History:  Procedure Laterality Date  . BILATERAL CARPAL TUNNEL RELEASE Bilateral 09/05/2014   Procedure: BILATERAL CARPAL TUNNEL RELEASE;  Surgeon: Myra Rudehristopher Smith, MD;  Location: [facility] ORS;  Service: Orthopedics;  Laterality: Bilateral;  . NOSE SURGERY    . TONSILLECTOMY     Social History   Occupational History  . Not on file  Tobacco Use  . Smoking status: Never Smoker  . Smokeless tobacco: Never Used  Substance and Sexual Activity  . Alcohol use: No  . Drug use: No  . Sexual activity: Yes

## 2018-06-14 ENCOUNTER — Telehealth: Payer: Self-pay | Admitting: Orthopaedic Surgery

## 2018-06-14 ENCOUNTER — Other Ambulatory Visit: Payer: Self-pay | Admitting: Orthopaedic Surgery

## 2018-06-14 MED ORDER — TRAMADOL HCL 50 MG PO TABS: 50.0000 mg | ORAL_TABLET | Freq: Four times a day (QID) | ORAL | 0 refills | Status: DC | PRN

## 2018-06-14 NOTE — Telephone Encounter (Signed)
Please check on MRI -will call in tramadol

## 2018-06-14 NOTE — Telephone Encounter (Signed)
Patient had an appt on Tuesday for Lspine pain. Patient calling because he is in so much pain. Per patient, he cannot seem to get any comfort. Pain in Lspine, and Lt side leg. Patient can barely stand to get out of bed. Patient unable to sleep due to pain. Patient uses [facility] in [place]. Please call to advise.

## 2018-06-14 NOTE — Telephone Encounter (Signed)
Please advise 

## 2018-06-14 NOTE — Telephone Encounter (Signed)
Spoke with patient and let him know that authorization for MRI can take up to a week. I also let him know that script has been sent to pharmacy.

## 2018-06-22 ENCOUNTER — Telehealth: Payer: Self-pay | Admitting: Orthopaedic Surgery

## 2018-06-22 NOTE — Telephone Encounter (Signed)
FYI: Patient called stating his insurance called to request he go to [facility] instead of [facility]. [employer] estimated cost for [facility] as $1200., and [facility] $500. Patient requested to have referral changed to [facility]. Authorization # [id number]. Lear Ng, and requested MRI referral to be changed.

## 2018-06-23 ENCOUNTER — Other Ambulatory Visit: Payer: PRIVATE HEALTH INSURANCE

## 2018-06-27 ENCOUNTER — Telehealth: Payer: Self-pay | Admitting: Orthopaedic Surgery

## 2018-06-27 NOTE — Telephone Encounter (Signed)
Pt is scheduled at [facility] on June 2 at 12pm, pt is aware of appt

## 2018-06-27 NOTE — Telephone Encounter (Signed)
Patient requesting a call back to verify MRI order has been sent to [facility]. Patient states [facility] is telling him they have not received order yet. Please call to advise.

## 2018-06-28 ENCOUNTER — Ambulatory Visit: Payer: PRIVATE HEALTH INSURANCE | Admitting: Orthopaedic Surgery

## 2018-07-17 ENCOUNTER — Ambulatory Visit: Payer: Managed Care, Other (non HMO) | Admitting: Orthopaedic Surgery

## 2018-07-17 ENCOUNTER — Encounter: Payer: Self-pay | Admitting: Orthopaedic Surgery

## 2018-07-17 ENCOUNTER — Other Ambulatory Visit: Payer: Self-pay

## 2018-07-17 VITALS — BP 168/103 | HR 94 | Ht 72.0 in | Wt 280.0 lb

## 2018-07-17 DIAGNOSIS — M5442 Lumbago with sciatica, left side: Secondary | ICD-10-CM | POA: Diagnosis not present

## 2018-07-17 DIAGNOSIS — M545 Low back pain, unspecified: Secondary | ICD-10-CM | POA: Insufficient documentation

## 2018-07-17 DIAGNOSIS — E669 Obesity, unspecified: Secondary | ICD-10-CM | POA: Diagnosis not present

## 2018-07-17 DIAGNOSIS — G8929 Other chronic pain: Secondary | ICD-10-CM

## 2018-07-17 NOTE — Progress Notes (Signed)
Office Visit Note   Patient: [facility]           Date of Birth: [birth year]           MRN: [id number] Visit Date: 07/17/2018              Requested by: No referring provider defined for this encounter. PCP: Patient, No Pcp Per   Assessment & Plan: Visit Diagnoses:  1. Obesity (BMI 30-39.9)   2. Chronic left-sided low back pain with left-sided sciatica     Plan: MRI scan demonstrates moderate left central narrowing of the thecal sac at T11-12 due to a left paracentral disc protrusion.  There is prominent left and borderline right subarticular lateral recess stenosis with moderate central canal narrowing of the thecal sac due to a left paracentral and lateral recess disc protruding.  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  There is mild right foraminal stenosis at L5-S1.  Mr. Friedhoff is significantly better.  However, he does have difficulty with back pain when he stands for any length of time.  Not having much trouble with lower extremity pain.  Had some difficulty with urinating in the past but presently that is "okay".  We will set up a course of physical therapy at [facility].  Also we will asked Dr. Ernestina Patches to consider an epidural steroid injection.  Office 1 month  Follow-Up Instructions: Return in about 4 weeks (around 08/14/2018).   Orders:  No orders of the defined types were placed in this encounter.  No orders of the defined types were placed in this encounter.     Procedures: No procedures performed   Clinical Data: No additional findings.   Subjective: Chief Complaint  Patient presents with  . Lower Back - Follow-up  Patient presents today for a follow up on his lower back pain. He had an MRI on 07/10/18 and is here today for those results. He has had some improvement since his last visit with rest. He is taking Tramadol if needed.   HPI  Review of Systems  Constitutional: Negative for fatigue.  HENT: Negative  for ear pain.   Eyes: Negative for pain.  Respiratory: Negative for shortness of breath.   Cardiovascular: Negative for leg swelling.  Gastrointestinal: Negative for constipation and diarrhea.  Endocrine: Negative for cold intolerance and heat intolerance.  Genitourinary: Negative for difficulty urinating.  Musculoskeletal: Negative for joint swelling.  Skin: Negative for rash.  Allergic/Immunologic: Negative for food allergies.  Neurological: Negative for weakness.  Hematological: Does not bruise/bleed easily.  Psychiatric/Behavioral: Negative for sleep disturbance.     Objective: Vital Signs: BP (!) 168/103   Pulse 94   Ht 6' (1.829 m)   Wt 280 lb (127 kg)   BMI 37.97 kg/m   Physical Exam Constitutional:      Appearance: He is well-developed.  Eyes:     Pupils: Pupils are equal, round, and reactive to light.  Pulmonary:     Effort: Pulmonary effort is normal.  Skin:    General: Skin is warm and dry.  Neurological:     Mental Status: He is alert and oriented to person, place, and time.  Psychiatric:        Behavior: Behavior normal.     Ortho Exam awake alert and oriented sitting.  Walks without a limp.  Straight leg raise was negative.  Painless range of motion both hips.  Both knee and ankle reflexes are symmetrical.  Motor and sensory  exam intact.  No percussible tenderness of lumbar spine Specialty Comments:  No specialty comments available.  Imaging: No results found.   PMFS History: Patient Active Problem List   Diagnosis Date Noted  . Low back pain 07/17/2018  . Obesity (BMI 30-39.9) 01/09/2018   History reviewed. No pertinent past medical history.  History reviewed. No pertinent family history.  Past Surgical History:  Procedure Laterality Date  . BILATERAL CARPAL TUNNEL RELEASE Bilateral 09/05/2014   Procedure: BILATERAL CARPAL TUNNEL RELEASE;  Surgeon: Myra Rudehristopher Smith, MD;  Location: [facility] ORS;  Service: Orthopedics;  Laterality: Bilateral;  .  NOSE SURGERY    . TONSILLECTOMY     Social History   Occupational History  . Not on file  Tobacco Use  . Smoking status: Never Smoker  . Smokeless tobacco: Never Used  Substance and Sexual Activity  . Alcohol use: No  . Drug use: No  . Sexual activity: Yes

## 2018-07-17 NOTE — Addendum Note (Signed)
Addended by: Lendon Collar on: 07/17/2018 04:16 PM   Modules accepted: Orders

## 2018-08-09 ENCOUNTER — Encounter: Payer: Managed Care, Other (non HMO) | Admitting: Physical Medicine and Rehabilitation

## 2018-08-14 ENCOUNTER — Telehealth: Payer: Self-pay | Admitting: Orthopaedic Surgery

## 2018-08-14 NOTE — Telephone Encounter (Signed)
FYI: Patient canceled follow up appt stating he has not had an injection. He canceled injection appt with Dr. Ernestina Patches due to the fact that his pain subsided. Patient has been doing exercises which seem to help. Patient will call back, and reschedule injection if pain comes back.

## 2018-08-14 NOTE — Telephone Encounter (Signed)
Just an FYI

## 2018-08-15 NOTE — Telephone Encounter (Signed)
thanks

## 2018-08-16 ENCOUNTER — Ambulatory Visit: Payer: Managed Care, Other (non HMO) | Admitting: Orthopaedic Surgery

## 2018-12-12 ENCOUNTER — Telehealth: Payer: Self-pay | Admitting: Orthopaedic Surgery

## 2018-12-12 MED ORDER — GABAPENTIN 300 MG PO CAPS: 300.0000 mg | ORAL_CAPSULE | Freq: Every day | ORAL | 0 refills | Status: AC

## 2018-12-12 NOTE — Telephone Encounter (Signed)
Gabapentin 300mg  po qhs #30

## 2018-12-12 NOTE — Telephone Encounter (Signed)
Sent to pharmacy. I called patient and advised. 

## 2018-12-12 NOTE — Telephone Encounter (Signed)
Patient called stating that his back is now bothering him again.  He did not get the injection from Dr. Ernestina Patches.  He would rather not get the injection because he can not stand needles.  He wanted to know if Dr. Durward Fortes would prescribe him Gabapentin instead.  CB#[phone].  Thank you.

## 2018-12-12 NOTE — Telephone Encounter (Signed)
Please advise 

## 2018-12-25 ENCOUNTER — Telehealth: Payer: Self-pay | Admitting: Physical Medicine and Rehabilitation

## 2018-12-25 NOTE — Telephone Encounter (Signed)
Service [phone] Authorization [id number] Auth Effective Date:11/24/2020Auth End Date:05/23/2021Initiated Date:11/24/2020Decision Date:11/24/2020Decision Type :InitialCase Status:Approved

## 2018-12-28 ENCOUNTER — Other Ambulatory Visit: Payer: Self-pay | Admitting: Orthopaedic Surgery

## 2018-12-28 MED ORDER — TRAMADOL HCL 50 MG PO TABS: 50.0000 mg | ORAL_TABLET | Freq: Three times a day (TID) | ORAL | 2 refills | Status: DC | PRN

## 2018-12-30 ENCOUNTER — Inpatient Hospital Stay
Admission: EM | Admit: 2018-12-30 | Discharge: 2019-01-02 | DRG: 520 | Disposition: A | Discharge status: Home / Self Care | Payer: Managed Care, Other (non HMO) | Attending: Internal Medicine | Admitting: Internal Medicine

## 2018-12-30 ENCOUNTER — Emergency Department: Payer: Managed Care, Other (non HMO)

## 2018-12-30 ENCOUNTER — Other Ambulatory Visit: Payer: Self-pay

## 2018-12-30 DIAGNOSIS — R3911 Hesitancy of micturition: Secondary | ICD-10-CM | POA: Diagnosis present

## 2018-12-30 DIAGNOSIS — Z419 Encounter for procedure for purposes other than remedying health state, unspecified: Secondary | ICD-10-CM

## 2018-12-30 DIAGNOSIS — M4726 Other spondylosis with radiculopathy, lumbar region: Secondary | ICD-10-CM | POA: Diagnosis present

## 2018-12-30 DIAGNOSIS — M51369 Other intervertebral disc degeneration, lumbar region without mention of lumbar back pain or lower extremity pain: Secondary | ICD-10-CM

## 2018-12-30 DIAGNOSIS — M545 Low back pain, unspecified: Secondary | ICD-10-CM | POA: Diagnosis present

## 2018-12-30 DIAGNOSIS — M47814 Spondylosis without myelopathy or radiculopathy, thoracic region: Secondary | ICD-10-CM | POA: Diagnosis present

## 2018-12-30 DIAGNOSIS — Z79890 Hormone replacement therapy (postmenopausal): Secondary | ICD-10-CM

## 2018-12-30 DIAGNOSIS — M5116 Intervertebral disc disorders with radiculopathy, lumbar region: Principal | ICD-10-CM | POA: Diagnosis present

## 2018-12-30 DIAGNOSIS — M5134 Other intervertebral disc degeneration, thoracic region: Secondary | ICD-10-CM

## 2018-12-30 DIAGNOSIS — M5459 Other low back pain: Secondary | ICD-10-CM | POA: Diagnosis present

## 2018-12-30 DIAGNOSIS — Z791 Long term (current) use of non-steroidal anti-inflammatories (NSAID): Secondary | ICD-10-CM

## 2018-12-30 DIAGNOSIS — M5124 Other intervertebral disc displacement, thoracic region: Secondary | ICD-10-CM | POA: Diagnosis present

## 2018-12-30 DIAGNOSIS — Z79899 Other long term (current) drug therapy: Secondary | ICD-10-CM

## 2018-12-30 DIAGNOSIS — M4804 Spinal stenosis, thoracic region: Secondary | ICD-10-CM | POA: Diagnosis present

## 2018-12-30 DIAGNOSIS — G8929 Other chronic pain: Secondary | ICD-10-CM | POA: Diagnosis present

## 2018-12-30 DIAGNOSIS — M5126 Other intervertebral disc displacement, lumbar region: Secondary | ICD-10-CM

## 2018-12-30 DIAGNOSIS — Z20828 Contact with and (suspected) exposure to other viral communicable diseases: Secondary | ICD-10-CM | POA: Diagnosis present

## 2018-12-30 DIAGNOSIS — Z79891 Long term (current) use of opiate analgesic: Secondary | ICD-10-CM

## 2018-12-30 DIAGNOSIS — Z885 Allergy status to narcotic agent status: Secondary | ICD-10-CM

## 2018-12-30 DIAGNOSIS — E669 Obesity, unspecified: Secondary | ICD-10-CM | POA: Diagnosis present

## 2018-12-30 DIAGNOSIS — M48061 Spinal stenosis, lumbar region without neurogenic claudication: Secondary | ICD-10-CM | POA: Diagnosis present

## 2018-12-30 DIAGNOSIS — Z6838 Body mass index (BMI) 38.0-38.9, adult: Secondary | ICD-10-CM

## 2018-12-30 DIAGNOSIS — M5136 Other intervertebral disc degeneration, lumbar region: Secondary | ICD-10-CM

## 2018-12-30 MED ORDER — METHOCARBAMOL 500 MG PO TABS: 500.0000 mg | ORAL_TABLET | Freq: Four times a day (QID) | ORAL | 0 refills | Status: DC

## 2018-12-30 MED ORDER — PROMETHAZINE HCL 25 MG PO TABS
25.0000 mg | ORAL_TABLET | Freq: Once | ORAL | Status: AC
  Administered 2018-12-31: 25 mg via ORAL
  Filled 2018-12-30: qty 1

## 2018-12-30 MED ORDER — KETOROLAC TROMETHAMINE 30 MG/ML IJ SOLN
30.0000 mg | Freq: Once | INTRAMUSCULAR | Status: AC
  Administered 2018-12-31: 30 mg via INTRAMUSCULAR
  Filled 2018-12-30: qty 1

## 2018-12-30 MED ORDER — DEXAMETHASONE SODIUM PHOSPHATE 10 MG/ML IJ SOLN
10.0000 mg | Freq: Once | INTRAMUSCULAR | Status: AC
  Administered 2018-12-31: 10 mg via INTRAMUSCULAR
  Filled 2018-12-30: qty 1

## 2018-12-30 MED ORDER — ORPHENADRINE CITRATE 30 MG/ML IJ SOLN
60.0000 mg | Freq: Once | INTRAMUSCULAR | Status: AC
  Administered 2018-12-31: 60 mg via INTRAMUSCULAR
  Filled 2018-12-30: qty 2

## 2018-12-30 MED ORDER — ONDANSETRON 8 MG PO TBDP
8.0000 mg | ORAL_TABLET | Freq: Once | ORAL | Status: AC
  Administered 2018-12-30: 8 mg via ORAL
  Filled 2018-12-30: qty 1

## 2018-12-30 MED ORDER — MORPHINE SULFATE (PF) 4 MG/ML IV SOLN
4.0000 mg | Freq: Once | INTRAVENOUS | Status: AC
  Administered 2018-12-30: 4 mg via INTRAMUSCULAR
  Filled 2018-12-30: qty 1

## 2018-12-30 MED ORDER — MELOXICAM 15 MG PO TABS: 15.0000 mg | ORAL_TABLET | Freq: Every day | ORAL | 0 refills | Status: DC

## 2018-12-30 MED ORDER — PREDNISONE 10 MG PO TABS: 10.0000 mg | ORAL_TABLET | Freq: Every day | ORAL | 0 refills | Status: DC

## 2018-12-30 NOTE — ED Notes (Signed)
Pt otf for imaging 

## 2018-12-30 NOTE — ED Provider Notes (Signed)
[facility] Emergency Department Provider Note  ____________________________________________  Time seen: Approximately 8:34 PM  I have reviewed the triage vital signs and the nursing notes.   HISTORY  Chief Complaint Back Pain    HPI Kyle RalphsBradley S [facility] is a 43 y.o. male who presents the emergency department complaining of low back pain extending down the left lower extremity.  Patient states that he has a history of chronic back problems, was seen by orthopedics and had an MRI which showed a bulging disc in the lower thoracic spine.  Patient reports that he has had intermittent problems, pain is not constant.  Patient states that he had a flare approximately 8 months ago, was seen in physical therapy with good success.  Patient has not had any other treatments until he is been experiencing worsening back pain over the past several days.  Patient states that tonight he was trying to stretch his back a little and was in the process of sitting on the ground when he felt a sharp pain it is now having numbness and tingling of the left lower extremity.  No bowel or bladder dysfunction, saddle anesthesia. .  Patient states that he has never had this sensation in the past.  Patient reports excruciating back pain with this change in sensation.  Patient denies any urinary or GI complaints.         History reviewed. No pertinent past medical history.  Patient Active Problem List   Diagnosis Date Noted  . Low back pain 07/17/2018  . Obesity (BMI 30-39.9) 01/09/2018    Past Surgical History:  Procedure Laterality Date  . BILATERAL CARPAL TUNNEL RELEASE Bilateral 09/05/2014   Procedure: BILATERAL CARPAL TUNNEL RELEASE;  Surgeon: Myra Rudehristopher Smith, MD;  Location: [facility] ORS;  Service: Orthopedics;  Laterality: Bilateral;  . NOSE SURGERY    . TONSILLECTOMY      Prior to Admission medications   Medication Sig Start Date End Date Taking? Authorizing Provider  acetaminophen  (TYLENOL) 325 MG tablet Take 650 mg by mouth every 6 (six) hours as needed.    [provider]  gabapentin (NEURONTIN) 300 MG capsule Take 1 capsule (300 mg total) by mouth at bedtime. 12/12/18   Valeria BatmanWhitfield, Peter W, MD  ibuprofen (ADVIL) 200 MG tablet Take 200 mg by mouth every 6 (six) hours as needed.    [provider]  Melatonin 2.5 MG CHEW Chew by mouth.    [provider]  meloxicam (MOBIC) 15 MG tablet Take 1 tablet (15 mg total) by mouth daily. 12/30/18   Cuthriell, Delorise RoyalsJonathan D, PA-C  methocarbamol (ROBAXIN) 500 MG tablet Take 1 tablet (500 mg total) by mouth 4 (four) times daily. 12/30/18   Cuthriell, Delorise RoyalsJonathan D, PA-C  predniSONE (DELTASONE) 10 MG tablet Take 1 tablet (10 mg total) by mouth daily. 12/30/18   Cuthriell, Delorise RoyalsJonathan D, PA-C  traMADol (ULTRAM) 50 MG tablet Take 1-2 tablets (50-100 mg total) by mouth every 6 (six) hours as needed. 06/14/18   Valeria BatmanWhitfield, Peter W, MD  traMADol (ULTRAM) 50 MG tablet Take 1-2 tablets (50-100 mg total) by mouth 3 (three) times daily as needed. 12/28/18   Tarry KosXu, Naiping M, MD    Allergies Hydrocodone and Oxycodone  History reviewed. No pertinent family history.  Social History Social History   Tobacco Use  . Smoking status: Never Smoker  . Smokeless tobacco: Never Used  Substance Use Topics  . Alcohol use: No  . Drug use: No     Review of Systems  Constitutional: No fever/chills Eyes: No visual changes. No discharge ENT: No upper respiratory complaints. Cardiovascular: no chest pain. Respiratory: no cough. No SOB. Gastrointestinal: No abdominal pain.  No nausea, no vomiting.  No diarrhea.  No constipation. Genitourinary: Negative for dysuria. No hematuria Musculoskeletal: Sharp lower back pain with numbness and tingling down the left lower extremity. Skin: Negative for rash, abrasions, lacerations, ecchymosis. Neurological: Negative for headaches, focal weakness or numbness. 10-point ROS otherwise  negative.  ____________________________________________   PHYSICAL EXAM:  VITAL SIGNS: ED Triage Vitals  Enc Vitals Group     BP      Pulse      Resp      Temp      Temp src      SpO2      Weight      Height      Head Circumference      Peak Flow      Pain Score      Pain Loc      Pain Edu?      Excl. in GC?      Constitutional: Alert and oriented. Well appearing and in no acute distress. Eyes: Conjunctivae are normal. PERRL. EOMI. Head: Atraumatic. ENT:      Ears:       Nose: No congestion/rhinnorhea.      Mouth/Throat: Mucous membranes are moist.  Neck: No stridor.    Cardiovascular: Normal rate, regular rhythm. Normal S1 and S2.  Good peripheral circulation. Respiratory: Normal respiratory effort without tachypnea or retractions. Lungs CTAB. Good air entry to the bases with no decreased or absent breath sounds. Gastrointestinal: Bowel sounds 4 quadrants. Soft and nontender to palpation. No guarding or rigidity. No palpable masses. No distention. No CVA tenderness. Musculoskeletal: Full range of motion to all extremities. No gross deformities appreciated.  Visualization of the thoracic and lumbar spine reveals no visible abnormality.  Patient is nontender to palpation midline in the thoracic or lumbar spine.  No significant paraspinal muscle tenderness in the thoracic spine.  No tenderness on the right paraspinal muscle region.  Patient does have tenderness in the lower lumbar spine on the left paraspinal muscle group extending into the SI joint.  Patient is tender to palpation over the sciatic notch on the left side.  Nontender to the sciatic notch on the right side.  Patient with positive straight leg raise left side.  Dorsalis pedis pulse intact bilateral lower extremities.  Patient reports sensation in the left lower extremity but does report that sensation is decreased in all dermatomal distributions left side when compared with right. Neurologic:  Normal speech and  language. No gross focal neurologic deficits are appreciated.  Skin:  Skin is warm, dry and intact. No rash noted. Psychiatric: Mood and affect are normal. Speech and behavior are normal. Patient exhibits appropriate insight and judgement.   ____________________________________________   LABS (all labs ordered are listed, but only abnormal results are displayed)  Labs Reviewed  SARS CORONAVIRUS 2 (TAT 6-24 HRS)   ____________________________________________  EKG   ____________________________________________  RADIOLOGY I personally viewed and evaluated these images as part of my medical decision making, as well as reviewing the written report by the radiologist.  Dg Thoracic Spine 2 View  Result Date: 12/30/2018 CLINICAL DATA:  Chronic back pain.  Left leg numbness. EXAM: THORACIC SPINE 2 VIEWS COMPARISON:  None. FINDINGS: There is no evidence of thoracic spine fracture. Alignment is normal. No other significant bone abnormalities are identified. IMPRESSION: Negative. Electronically Signed  By: Charlett Nose M.D.   On: 12/30/2018 21:40   Dg Lumbar Spine 2-3 Views  Result Date: 12/30/2018 CLINICAL DATA:  Chronic back pain.  Left leg numbness. EXAM: LUMBAR SPINE - 2-3 VIEW COMPARISON:  None. FINDINGS: There is no evidence of lumbar spine fracture. Alignment is normal. Intervertebral disc spaces are maintained. IMPRESSION: Negative. Electronically Signed   By: Charlett Nose M.D.   On: 12/30/2018 21:40   Mr Thoracic Spine Wo Contrast  Result Date: 12/30/2018 CLINICAL DATA:  Rapidly progressive back pain, prior bulging disc at T11-T12 EXAM: MRI THORACIC SPINE WITHOUT CONTRAST TECHNIQUE: Multiplanar, multisequence MR imaging of the thoracic spine was performed. No intravenous contrast was administered. COMPARISON:  MRI July 10, 2018 FINDINGS: Alignment: Normal Vertebrae: Vertebral body heights are well maintained. No fracture, marrow edema, or pathologic infiltration. Cord: Normal in  signal and morphology. Paraspinal and other soft tissues: Normal appearance to the paraspinal soft tissues and retroperitoneum. Disc levels: T1-T2: No significant canal or neural foraminal narrowing T2-T3: No significant canal or neural foraminal narrowing T3-T4: No significant canal or neural foraminal narrowing T4-T5: No significant canal or neural foraminal narrowing T5-T6: No significant canal or neural foraminal narrowing T6-T7: No significant canal or neural foraminal narrowing T7-T8: There is a minimal broad-based disc bulge, however no significant canal or neural foraminal narrowing. T8-T9: There is a broad-based disc bulge with a right paracentral disc protrusion which causes mild effacement of the anterior thecal sac T9-T10: No significant canal or neural foraminal narrowing T10-T11: No significant canal or neural foraminal narrowing T11-T12: Again noted is a left paracentral disc protrusion/extrusion and measuring 8 mm in AP dimension which causes effacement of the anterior thecal sac which measures 6 mm in AP dimension. 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 Electronically Signed   By: Jonna Clark M.D.   On: 12/30/2018 23:24   Mr Lumbar Spine Wo Contrast  Result Date: 12/30/2018 CLINICAL DATA:  Back pain worsening over the last 6 weeks radiating into both lower extremities EXAM: MRI LUMBAR SPINE WITHOUT CONTRAST TECHNIQUE: Multiplanar, multisequence MR imaging of the lumbar spine was performed. No intravenous contrast was administered. COMPARISON:  July 10, 2018 FINDINGS: Segmentation: There are 5 non-rib bearing lumbar type vertebral bodies with the last intervertebral disc space labeled as L5-S1. Alignment:  Normal Vertebrae: The vertebral body heights are well maintained. No fracture, marrow edema,or pathologic marrow infiltration. Fatty endplate changes are seen at L1, L3, and L4. Conus  medullaris and cauda equina: Conus extends to the L1 level. Conus and cauda equina appear normal. 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 Paraspinal and other soft tissues: The paraspinal soft tissues and visualized retroperitoneal structures are unremarkable. The sacroiliac joints are intact. Disc levels: T12-L1:  No significant canal or neural foraminal narrowing. L1-L2:   No significant canal or neural foraminal narrowing. L2-L3: There is a minimal broad-based disc bulge with facet arthrosis which causes mild bilateral neural foraminal narrowing there is mild effacement anterior thecal sac. L3-L4: There is a broad-based disc bulge with facet arthrosis and ligamentum flavum hypertrophy which causes mild bilateral neural foraminal narrowing. The central thecal sac is mildly effaced measuring 8 mm in AP dimension. There is prominent epidural lipomatosis. L4-L5: Again noted is a left paracentral/lateral recess disc extrusion heading in the caudal direction measuring 7 mm in AP diameter. This disc extrusion contacts and impinges the descending left L5 nerve  root. The central thecal sac is effaced measuring 4 mm in AP dimension. There is moderate bilateral neural foraminal narrowing. There is also prominent epidural lipomatosis. L5-S1: There is a broad-based disc bulge with facet arthrosis which causes moderate right and mild left neural foraminal narrowing. There is prominent epidural lipomatosis. The central thecal sac is effaced measuring 5 mm in AP dimension. 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 There is also severe central canal stenosis at this level. This was seen on the prior exam and not significantly changed. 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   On: 12/30/2018 23:30    ____________________________________________    PROCEDURES  Procedure(s) performed:    Procedures    Medications  HYDROmorphone (DILAUDID) injection 1 mg (has no administration in time range)  ondansetron (ZOFRAN-ODT) disintegrating tablet 8 mg (8 mg Oral Given 12/30/18 2150)  morphine 4 MG/ML injection 4 mg (4 mg Intramuscular Given 12/30/18 2151)  dexamethasone (DECADRON) injection 10 mg (10 mg Intramuscular Given 12/31/18 0041)  orphenadrine (NORFLEX) injection 60 mg (60 mg Intramuscular Given 12/31/18 0042)  ketorolac (TORADOL) 30 MG/ML injection 30 mg (30 mg Intramuscular Given 12/31/18 0039)  promethazine (PHENERGAN) tablet 25 mg (25 mg Oral Given 12/31/18 0038)     ____________________________________________   INITIAL IMPRESSION / ASSESSMENT AND PLAN / ED COURSE  Pertinent labs & imaging results that were available during my care of the patient were reviewed by me and considered in my medical decision making (see chart for details).  Review of the [place] CSRS was performed in accordance of the [place] prior to dispensing any controlled drugs.           Patient's diagnosis is consistent with lower back pain with sciatica.  Patient presented to the emergency department with acute on chronic back pain.  Patient has had issues with back pain intermittently in the past.  Patient has a known bulging disc in his thoracic spine.  Patient had increased back pain, was performing stretch when he had a sharp pain in his lower back and had numbness and tingling running down his left leg.  Patient has not had no direct trauma.  No urinary or GI complaints.  Given new symptoms of numbness and tingling running down the left leg which is a change from patient's normal patient was imaged with x-ray and MRI.  Patient has bulging disc identified in both the thoracic and lumbar spine.  These findings are consistent with previous imaging with no  acute changes.  Has had no bowel or bladder incontinence, saddle anesthesia.  At this time patient will be given Toradol, Norflex, Decadron and Phenergan for symptom relief.    After medications here in the emergency department patient had slight improvement in overall pain but patient had been laying prone since arrival.  As patient tried to sit up to attempt to ambulate pain became exquisite and patient was unable to even sit on the edge of the bed.  At this time, patient will be admitted to the hospital service for pain managemen.  At this time patient will be transferred to the hospitalist service.    ____________________________________________  FINAL CLINICAL IMPRESSION(S) / ED DIAGNOSES  Final diagnoses:  Chronic left-sided low back pain with left-sided sciatica  Bulging lumbar disc  Bulging of thoracic intervertebral disc  NEW MEDICATIONS STARTED DURING THIS VISIT:  ED Discharge Orders         Ordered    predniSONE (DELTASONE) 10 MG tablet  Daily    Note to Pharmacy: Take 6 pills x 2 days, 5 pills x 2 days, 4 pills x 2 days, 3 pills x 2 days, 2 pills x 2 days, and 1 pill x 2 days   12/30/18 2358    meloxicam (MOBIC) 15 MG tablet  Daily     12/30/18 2358    methocarbamol (ROBAXIN) 500 MG tablet  4 times daily     12/30/18 2358              This chart was dictated using voice recognition software/Dragon. Despite best efforts to proofread, errors can occur which can change the meaning. Any change was purely unintentional.    Racheal Patches, PA-C 12/31/18 0147    Chesley Noon, MD 12/31/18 [phone]

## 2018-12-30 NOTE — ED Triage Notes (Signed)
Pt here via [facility] from home for back pain radiating to both lower extremities. Has chronic back pain, had a spasm in back then tried to lower himself to the floor. Reports left leg is numb at this time. A&Ox4. BP 192/116, reports hx HTN.

## 2018-12-31 ENCOUNTER — Telehealth: Payer: Self-pay | Admitting: Physical Medicine and Rehabilitation

## 2018-12-31 ENCOUNTER — Other Ambulatory Visit: Payer: Self-pay

## 2018-12-31 DIAGNOSIS — M545 Low back pain, unspecified: Secondary | ICD-10-CM | POA: Diagnosis present

## 2018-12-31 DIAGNOSIS — G8929 Other chronic pain: Secondary | ICD-10-CM | POA: Diagnosis present

## 2018-12-31 DIAGNOSIS — M4726 Other spondylosis with radiculopathy, lumbar region: Secondary | ICD-10-CM | POA: Diagnosis present

## 2018-12-31 DIAGNOSIS — M5459 Other low back pain: Secondary | ICD-10-CM | POA: Diagnosis present

## 2018-12-31 DIAGNOSIS — Z79891 Long term (current) use of opiate analgesic: Secondary | ICD-10-CM | POA: Diagnosis not present

## 2018-12-31 DIAGNOSIS — Z79890 Hormone replacement therapy (postmenopausal): Secondary | ICD-10-CM | POA: Diagnosis not present

## 2018-12-31 DIAGNOSIS — Z79899 Other long term (current) drug therapy: Secondary | ICD-10-CM | POA: Diagnosis not present

## 2018-12-31 DIAGNOSIS — M5442 Lumbago with sciatica, left side: Secondary | ICD-10-CM | POA: Diagnosis not present

## 2018-12-31 DIAGNOSIS — M5126 Other intervertebral disc displacement, lumbar region: Secondary | ICD-10-CM | POA: Diagnosis not present

## 2018-12-31 DIAGNOSIS — R3911 Hesitancy of micturition: Secondary | ICD-10-CM | POA: Diagnosis present

## 2018-12-31 DIAGNOSIS — E669 Obesity, unspecified: Secondary | ICD-10-CM | POA: Diagnosis present

## 2018-12-31 DIAGNOSIS — M4804 Spinal stenosis, thoracic region: Secondary | ICD-10-CM | POA: Diagnosis present

## 2018-12-31 DIAGNOSIS — Z20828 Contact with and (suspected) exposure to other viral communicable diseases: Secondary | ICD-10-CM | POA: Diagnosis present

## 2018-12-31 DIAGNOSIS — Z419 Encounter for procedure for purposes other than remedying health state, unspecified: Secondary | ICD-10-CM | POA: Diagnosis not present

## 2018-12-31 DIAGNOSIS — Z885 Allergy status to narcotic agent status: Secondary | ICD-10-CM | POA: Diagnosis not present

## 2018-12-31 DIAGNOSIS — M5116 Intervertebral disc disorders with radiculopathy, lumbar region: Secondary | ICD-10-CM | POA: Diagnosis present

## 2018-12-31 DIAGNOSIS — Z6838 Body mass index (BMI) 38.0-38.9, adult: Secondary | ICD-10-CM | POA: Diagnosis not present

## 2018-12-31 DIAGNOSIS — Z791 Long term (current) use of non-steroidal anti-inflammatories (NSAID): Secondary | ICD-10-CM | POA: Diagnosis not present

## 2018-12-31 DIAGNOSIS — M48061 Spinal stenosis, lumbar region without neurogenic claudication: Secondary | ICD-10-CM | POA: Diagnosis present

## 2018-12-31 DIAGNOSIS — M47814 Spondylosis without myelopathy or radiculopathy, thoracic region: Secondary | ICD-10-CM | POA: Diagnosis present

## 2018-12-31 DIAGNOSIS — M5124 Other intervertebral disc displacement, thoracic region: Secondary | ICD-10-CM | POA: Diagnosis present

## 2018-12-31 LAB — COMPREHENSIVE METABOLIC PANEL
ALT: 23 U/L (ref 0–44)
AST: 26 U/L (ref 15–41)
Albumin: 4.4 g/dL (ref 3.5–5.0)
Alkaline Phosphatase: 46 U/L (ref 38–126)
Anion gap: 10 (ref 5–15)
BUN: 22 mg/dL — ABNORMAL HIGH (ref 6–20)
CO2: 26 mmol/L (ref 22–32)
Calcium: 9.6 mg/dL (ref 8.9–10.3)
Chloride: 101 mmol/L (ref 98–111)
Creatinine, Ser: 1 mg/dL (ref 0.61–1.24)
GFR calc Af Amer: >60 mL/min (ref >60)
GFR calc non Af Amer: >60 mL/min (ref >60)
Glucose, Bld: 119 mg/dL — ABNORMAL HIGH (ref 70–99)
Potassium: 4.4 mmol/L (ref 3.5–5.1)
Sodium: 137 mmol/L (ref 135–145)
Total Bilirubin: 1 mg/dL (ref 0.3–1.2)
Total Protein: 7.5 g/dL (ref 6.5–8.1)

## 2018-12-31 LAB — URINALYSIS, ROUTINE W REFLEX MICROSCOPIC
Bacteria, UA: NONE SEEN
Bilirubin Urine: NEGATIVE
Glucose, UA: NEGATIVE mg/dL
Hgb urine dipstick: NEGATIVE
Ketones, ur: 20 mg/dL — AB
Leukocytes,Ua: NEGATIVE
Nitrite: NEGATIVE
Protein, ur: NEGATIVE mg/dL
Specific Gravity, Urine: 1.018 (ref 1.005–1.030)
pH: 5 (ref 5.0–8.0)

## 2018-12-31 LAB — CBC
HCT: 44.2 % (ref 39.0–52.0)
Hemoglobin: 15.4 g/dL (ref 13.0–17.0)
MCH: 28.3 pg (ref 26.0–34.0)
MCHC: 34.8 g/dL (ref 30.0–36.0)
MCV: 81.1 fL (ref 80.0–100.0)
Platelets: 252 10*3/uL (ref 150–400)
RBC: 5.45 MIL/uL (ref 4.22–5.81)
RDW: 12.1 % (ref 11.5–15.5)
WBC: 8.4 10*3/uL (ref 4.0–10.5)
nRBC: 0 % (ref 0.0–0.2)

## 2018-12-31 LAB — HIV ANTIBODY (ROUTINE TESTING W REFLEX): HIV Screen 4th Generation wRfx: NONREACTIVE

## 2018-12-31 LAB — SARS CORONAVIRUS 2 (TAT 6-24 HRS): SARS Coronavirus 2: NEGATIVE

## 2018-12-31 MED ORDER — PREDNISONE 10 MG PO TABS
10.0000 mg | ORAL_TABLET | ORAL | Status: AC
  Administered 2018-12-31: 10 mg via ORAL
  Filled 2018-12-31: qty 1

## 2018-12-31 MED ORDER — KETOROLAC TROMETHAMINE 30 MG/ML IJ SOLN
30.0000 mg | Freq: Four times a day (QID) | INTRAMUSCULAR | Status: DC | PRN
  Administered 2018-12-31 – 2019-01-01 (×3): 30 mg via INTRAVENOUS
  Filled 2018-12-31 (×3): qty 1

## 2018-12-31 MED ORDER — ACETAMINOPHEN 650 MG RE SUPP: 650.0000 mg | Freq: Four times a day (QID) | RECTAL | Status: DC | PRN

## 2018-12-31 MED ORDER — HYDROMORPHONE HCL 2 MG PO TABS: 1.0000 mg | ORAL_TABLET | ORAL | Status: DC | PRN

## 2018-12-31 MED ORDER — PREDNISONE 10 MG PO TABS: 20.0000 mg | ORAL_TABLET | Freq: Every evening | ORAL | Status: DC

## 2018-12-31 MED ORDER — PREDNISONE 10 MG PO TABS
10.0000 mg | ORAL_TABLET | Freq: Four times a day (QID) | ORAL | Status: DC
  Administered 2019-01-02: 10 mg via ORAL
  Filled 2018-12-31 (×2): qty 1

## 2018-12-31 MED ORDER — HEPARIN SODIUM (PORCINE) 5000 UNIT/ML IJ SOLN
5000.0000 [IU] | Freq: Three times a day (TID) | INTRAMUSCULAR | Status: DC
  Administered 2018-12-31 – 2019-01-01 (×5): 5000 [IU] via SUBCUTANEOUS
  Filled 2018-12-31 (×5): qty 1

## 2018-12-31 MED ORDER — PANTOPRAZOLE SODIUM 20 MG PO TBEC
20.0000 mg | DELAYED_RELEASE_TABLET | Freq: Every day | ORAL | Status: DC
  Administered 2018-12-31 – 2019-01-01 (×2): 20 mg via ORAL
  Filled 2018-12-31 (×3): qty 1

## 2018-12-31 MED ORDER — TRAMADOL HCL 50 MG PO TABS: 50.0000 mg | ORAL_TABLET | Freq: Four times a day (QID) | ORAL | Status: DC | PRN

## 2018-12-31 MED ORDER — PREDNISONE 10 MG PO TABS
20.0000 mg | ORAL_TABLET | Freq: Every morning | ORAL | Status: AC
  Administered 2018-12-31: 20 mg via ORAL
  Filled 2018-12-31: qty 2

## 2018-12-31 MED ORDER — METHYLPREDNISOLONE SODIUM SUCC 125 MG IJ SOLR
125.0000 mg | Freq: Once | INTRAMUSCULAR | Status: AC
  Administered 2018-12-31: 125 mg via INTRAVENOUS
  Filled 2018-12-31: qty 2

## 2018-12-31 MED ORDER — ONDANSETRON HCL 4 MG PO TABS: 4.0000 mg | ORAL_TABLET | Freq: Four times a day (QID) | ORAL | Status: DC | PRN

## 2018-12-31 MED ORDER — ONDANSETRON HCL 4 MG/2ML IJ SOLN: 4.0000 mg | Freq: Four times a day (QID) | INTRAMUSCULAR | Status: DC | PRN

## 2018-12-31 MED ORDER — CYCLOBENZAPRINE HCL 10 MG PO TABS
5.0000 mg | ORAL_TABLET | Freq: Three times a day (TID) | ORAL | Status: DC | PRN
  Administered 2019-01-01 (×2): 5 mg via ORAL
  Filled 2018-12-31 (×2): qty 1

## 2018-12-31 MED ORDER — PREDNISONE 10 MG PO TABS
20.0000 mg | ORAL_TABLET | Freq: Every evening | ORAL | Status: AC
  Administered 2018-12-31: 20 mg via ORAL
  Filled 2018-12-31: qty 2

## 2018-12-31 MED ORDER — ACETAMINOPHEN 325 MG PO TABS: 650.0000 mg | ORAL_TABLET | Freq: Four times a day (QID) | ORAL | Status: DC | PRN

## 2018-12-31 MED ORDER — PREDNISONE 10 MG PO TABS
10.0000 mg | ORAL_TABLET | Freq: Three times a day (TID) | ORAL | Status: AC
  Administered 2019-01-01 (×3): 10 mg via ORAL
  Filled 2018-12-31 (×3): qty 1

## 2018-12-31 MED ORDER — HYDROMORPHONE HCL 1 MG/ML IJ SOLN
1.0000 mg | INTRAMUSCULAR | Status: DC | PRN
  Administered 2018-12-31 – 2019-01-01 (×8): 1 mg via INTRAVENOUS
  Filled 2018-12-31 (×8): qty 1

## 2018-12-31 NOTE — H&P (Signed)
History and Physical    Kyle Russell Kyle Russell DOB: 1975/07/31 DOA: 12/30/2018  PCP: Idelle Crouch, MD  Patient coming from: Home   I have personally briefly reviewed patient's old medical records in [place]  Chief Complaint:  Intractable lower back pain   HPI: Kyle Russell is a 43 y.o. male with medical history significant of  Chronic lower back pain x1 year by orthopedics with known bulging disc in the lower thoracic and lumbar spine who presents to the ED with intractable back pain.  Patient was treated in ED with pain medications without significant improvement patient denies any associated fever chills, bladder or bowel dysfunction, saddle anesthesia however patient does note paresthesias left lower extremity but denies any weakness.  To patient's inability to ambulate and continued intractable back pain patient was considered unsafe discharge and was admitted for observation to the hospitalist service.  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  MRI lumbar spine:  Lumbar spine spondylosis most notable at L4-L5 with a left paracentral/lateral recess disc extrusion contacting and impinging the descending left L5 nerve root. There is also severe central canal stenosis at this level. This was seen on the prior exam and not significantly changed.  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   History reviewed. No pertinent past medical history.  Past Surgical History:  Procedure Laterality Date  . BILATERAL CARPAL TUNNEL RELEASE Bilateral 09/05/2014   Procedure: BILATERAL CARPAL TUNNEL RELEASE;  Surgeon: Christophe Louis, MD;  Location: [facility] ORS;  Service: Orthopedics;  Laterality: Bilateral;  .  NOSE SURGERY    . TONSILLECTOMY       reports that he has never smoked. He has never used smokeless tobacco. He reports that he does not drink alcohol or use drugs.  Allergies  Allergen Reactions  . Hydrocodone Swelling  . Oxycodone Swelling    History reviewed. No pertinent family history.  Prior to Admission medications   Medication Sig Start Date End Date Taking? Authorizing Provider  acetaminophen (TYLENOL) 325 MG tablet Take 650 mg by mouth every 6 (six) hours as needed.    [provider]  gabapentin (NEURONTIN) 300 MG capsule Take 1 capsule (300 mg total) by mouth at bedtime. 12/12/18   Garald Balding, MD  ibuprofen (ADVIL) 200 MG tablet Take 200 mg by mouth every 6 (six) hours as needed.    [provider]  Melatonin 2.5 MG CHEW Chew by mouth.    [provider]  meloxicam (MOBIC) 15 MG tablet Take 1 tablet (15 mg total) by mouth daily. 12/30/18   Cuthriell, Charline Bills, PA-C  methocarbamol (ROBAXIN) 500 MG tablet Take 1 tablet (500 mg total) by mouth 4 (four) times daily. 12/30/18   Cuthriell, Charline Bills, PA-C  predniSONE (DELTASONE) 10 MG tablet Take 1 tablet (10 mg total) by mouth daily. 12/30/18   Cuthriell, Charline Bills, PA-C  traMADol (ULTRAM) 50 MG tablet Take 1-2 tablets (50-100 mg total) by mouth every 6 (six) hours as needed. 06/14/18   Garald Balding, MD  traMADol (ULTRAM) 50 MG tablet Take 1-2 tablets (50-100 mg total) by mouth 3 (three) times daily as needed. 12/28/18   Leandrew Koyanagi, MD    Physical Exam: Vitals:   12/30/18 2037 12/30/18 2038 12/31/18 8315  12/31/18 0255  BP: (!) 165/109  (!) 147/94 (!) 159/101  Pulse: (!) 110  93 (!) 102  Resp: 18  18 20   Temp: 98.9 F (37.2 C)  98.2 F (36.8 C)   TempSrc: Oral  Oral   SpO2: 100%  95% 95%  Weight:  124.7 kg    Height:  6' (1.829 m)      Constitutional: NAD, calm, comfortable Vitals:   12/30/18 2037 12/30/18 2038 12/31/18 0208 12/31/18 0255  BP: (!) 165/109  (!) 147/94  (!) 159/101  Pulse: (!) 110  93 (!) 102  Resp: 18  18 20   Temp: 98.9 F (37.2 C)  98.2 F (36.8 C)   TempSrc: Oral  Oral   SpO2: 100%  95% 95%  Weight:  124.7 kg    Height:  6' (1.829 m)     Eyes: PERRL, lids and conjunctivae normal ENMT: Mucous membranes are moist. Posterior pharynx clear of any exudate or lesions.Normal dentition.  Neck: normal, supple, no masses, no thyromegaly Respiratory: clear to auscultation bilaterally, no wheezing, no crackles. Normal respiratory effort. No accessory muscle use.  Cardiovascular: Regular rate and rhythm, no murmurs / rubs / gallops. No extremity edema. 2+ pedal pulses. No carotid bruits.  Abdomen: no tenderness, no masses palpated. No hepatosplenomegaly. Bowel sounds positive.  Musculoskeletal: no clubbing / cyanosis. No joint deformity upper and lower extremities. Good ROM, no contractures. Tenderness, muscle spasms left lower lumbar region. Skin: no rashes, lesions, ulcers. No induration Neurologic: CN 2-12 grossly intact. Sensation intact, DTR normal. Strength 5/5 in all 4.  Psychiatric: Normal judgment and insight. Alert and oriented x 3. Normal mood.    Labs on Admission: I have personally reviewed following labs and imaging studies  CBC: No results for input(s): WBC, NEUTROABS, HGB, HCT, MCV, PLT in the last 168 hours. Basic Metabolic Panel: No results for input(s): NA, K, CL, CO2, GLUCOSE, BUN, CREATININE, CALCIUM, MG, PHOS in the last 168 hours. GFR: CrCl cannot be calculated (No successful lab value found.). Liver Function Tests: No results for input(s): AST, ALT, ALKPHOS, BILITOT, PROT, ALBUMIN in the last 168 hours. No results for input(s): LIPASE, AMYLASE in the last 168 hours. No results for input(s): AMMONIA in the last 168 hours. Coagulation Profile: No results for input(s): INR, PROTIME in the last 168 hours. Cardiac Enzymes: No results for input(s): CKTOTAL, CKMB, CKMBINDEX, TROPONINI in the last 168 hours. BNP (last 3  results) No results for input(s): PROBNP in the last 8760 hours. HbA1C: No results for input(s): HGBA1C in the last 72 hours. CBG: No results for input(s): GLUCAP in the last 168 hours. Lipid Profile: No results for input(s): CHOL, HDL, LDLCALC, TRIG, CHOLHDL, LDLDIRECT in the last 72 hours. Thyroid Function Tests: No results for input(s): TSH, T4TOTAL, FREET4, T3FREE, THYROIDAB in the last 72 hours. Anemia Panel: No results for input(s): VITAMINB12, FOLATE, FERRITIN, TIBC, IRON, RETICCTPCT in the last 72 hours. Urine analysis: No results found for: COLORURINE, APPEARANCEUR, LABSPEC, PHURINE, GLUCOSEU, HGBUR, BILIRUBINUR, KETONESUR, PROTEINUR, UROBILINOGEN, NITRITE, LEUKOCYTESUR  Radiological Exams on Admission: Dg Thoracic Spine 2 View  Result Date: 12/30/2018 CLINICAL DATA:  Chronic back pain.  Left leg numbness. EXAM: THORACIC SPINE 2 VIEWS COMPARISON:  None. FINDINGS: There is no evidence of thoracic spine fracture. Alignment is normal. No other significant bone abnormalities are identified. IMPRESSION: Negative. Electronically Signed   By: 09[birth year]04[birth year]1984 M.D.   On: 12/30/2018 21:40   Dg Lumbar Spine 2-3 Views  Result Date: 12/30/2018 CLINICAL DATA:  Chronic back pain.  Left leg numbness. EXAM: LUMBAR SPINE - 2-3 VIEW COMPARISON:  None. FINDINGS: There is no evidence of lumbar spine fracture. Alignment is normal. Intervertebral disc spaces are maintained. IMPRESSION: Negative. Electronically Signed   By: Charlett Nose M.D.   On: 12/30/2018 21:40   Mr Thoracic Spine Wo Contrast  Result Date: 12/30/2018 CLINICAL DATA:  Rapidly progressive back pain, prior bulging disc at T11-T12 EXAM: MRI THORACIC SPINE WITHOUT CONTRAST TECHNIQUE: Multiplanar, multisequence MR imaging of the thoracic spine was performed. No intravenous contrast was administered. COMPARISON:  MRI July 10, 2018 FINDINGS: Alignment: Normal Vertebrae: Vertebral body heights are well maintained. No fracture, marrow edema, or  pathologic infiltration. Cord: Normal in signal and morphology. Paraspinal and other soft tissues: Normal appearance to the paraspinal soft tissues and retroperitoneum. Disc levels: T1-T2: No significant canal or neural foraminal narrowing T2-T3: No significant canal or neural foraminal narrowing T3-T4: No significant canal or neural foraminal narrowing T4-T5: No significant canal or neural foraminal narrowing T5-T6: No significant canal or neural foraminal narrowing T6-T7: No significant canal or neural foraminal narrowing T7-T8: There is a minimal broad-based disc bulge, however no significant canal or neural foraminal narrowing. T8-T9: There is a broad-based disc bulge with a right paracentral disc protrusion which causes mild effacement of the anterior thecal sac T9-T10: No significant canal or neural foraminal narrowing T10-T11: No significant canal or neural foraminal narrowing T11-T12: Again noted is a left paracentral disc protrusion/extrusion and measuring 8 mm in AP dimension which causes effacement of the anterior thecal sac which measures 6 mm in AP dimension. 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 Electronically Signed   By: Jonna Clark M.D.   On: 12/30/2018 23:24   Mr Lumbar Spine Wo Contrast  Result Date: 12/30/2018 CLINICAL DATA:  Back pain worsening over the last 6 weeks radiating into both lower extremities EXAM: MRI LUMBAR SPINE WITHOUT CONTRAST TECHNIQUE: Multiplanar, multisequence MR imaging of the lumbar spine was performed. No intravenous contrast was administered. COMPARISON:  July 10, 2018 FINDINGS: Segmentation: There are 5 non-rib bearing lumbar type vertebral bodies with the last intervertebral disc space labeled as L5-S1. Alignment:  Normal Vertebrae: The vertebral body heights are well maintained. No fracture, marrow edema,or pathologic marrow infiltration. Fatty endplate  changes are seen at L1, L3, and L4. Conus medullaris and cauda equina: Conus extends to the L1 level. Conus and cauda equina appear normal. 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 Paraspinal and other soft tissues: The paraspinal soft tissues and visualized retroperitoneal structures are unremarkable. The sacroiliac joints are intact. Disc levels: T12-L1:  No significant canal or neural foraminal narrowing. L1-L2:   No significant canal or neural foraminal narrowing. L2-L3: There is a minimal broad-based disc bulge with facet arthrosis which causes mild bilateral neural foraminal narrowing there is mild effacement anterior thecal sac. L3-L4: There is a broad-based disc bulge with facet arthrosis and ligamentum flavum hypertrophy which causes mild bilateral neural foraminal narrowing. The central thecal sac is mildly effaced measuring 8 mm in AP dimension. There is prominent epidural lipomatosis. L4-L5: Again noted is a left paracentral/lateral recess disc extrusion heading in the caudal direction measuring 7 mm in AP diameter. This disc extrusion contacts and impinges the descending left L5 nerve root. The central thecal sac is effaced measuring 4 mm in AP dimension. There is moderate bilateral neural foraminal narrowing. There is also prominent  epidural lipomatosis. L5-S1: There is a broad-based disc bulge with facet arthrosis which causes moderate right and mild left neural foraminal narrowing. There is prominent epidural lipomatosis. The central thecal sac is effaced measuring 5 mm in AP dimension. 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 There is also severe central canal stenosis at this level. This was seen on the prior exam and not significantly changed. 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   On: 12/30/2018 23:30    EKG: N/A  Assessment/Plan  Active   Intractable low back pain due to Lumbar spine spondylosis most notable at L4-L5 with a left paracentral/lateral recess disc extrusion contacting and impingingthe descending left L5 nerve root. There is also severe central canal stenosis at this level.  -solumedrol x 1 now  -prednisone taper   -muscle relaxants prn  - supportive pain medications, patient prefers non-opioids  -toradol,ultram, prn.  -PT consult placed    FEN: F/u on admit labs  Replete prn       DVT prophylaxis: Heparin sq  Code Status:  FULL  Family Communication: N/A  Disposition Plan:  24 hours  Consults called:n/a  Admission status: observation   Lurline DelSara-Maiz A Ronnald Shedden MD [facility] Pager [phone] If 7PM-7AM, please contact night-coverage [email or web address] Password [email or web address]  12/31/2018, 3:27 AM

## 2018-12-31 NOTE — ED Notes (Signed)
Pharmacy tech at bedside 

## 2018-12-31 NOTE — ED Notes (Signed)
Pt states he does not want any more pain meds yet, this RN let him know that he can request some if needed.

## 2018-12-31 NOTE — ED Notes (Addendum)
Wife at bedside. Pt denies any needs currently. Declined warm blanket.

## 2018-12-31 NOTE — ED Notes (Addendum)
Pt given dinner tray and drink. Given warm blanket per request.

## 2018-12-31 NOTE — Telephone Encounter (Signed)
Patient's wife lmom stating that he is in the ED and they are wanting to see of he can get the injection sooner than 01/16/19. Call back # [phone]

## 2018-12-31 NOTE — Progress Notes (Signed)
Encouraged walking and cough and deep breathing to prevent pneumonia and clots

## 2018-12-31 NOTE — ED Notes (Signed)
Pt attempted to get OOB to walk. Pt unable to turn himself in bed to sit due to severe pain. Pt states he is concerned that he might fall and that if he goes home he won't be able to get in and out of the car. Pt unable to ambulate or even sit up in bed at this time. Pt laying flat on stomach due to pain and inability to move.

## 2018-12-31 NOTE — Progress Notes (Signed)
PROGRESS NOTE    Kyle Russell  GEX:[id number] DOB: 24-Apr-1975 DOA: 12/30/2018 PCP: Idelle Crouch, MD   Brief Narrative:  Kyle Russell is a 43 y.o. male with medical history significant of  Chronic lower back pain x1 year by orthopedics with known bulging disc in the lower thoracic and lumbar spine who presents to the ED with intractable back pain.  Patient was treated in ED with pain medications without significant improvement patient denies any associated fever chills, bladder or bowel dysfunction, saddle anesthesia however patient does note paresthesias left lower extremity but denies any weakness.  To patient's inability to ambulate and continued intractable back pain patient was considered unsafe discharge and was admitted for observation to the hospitalist service.  Patient still unable to ambulate secondary to severe pain, converted to inpatient and admitted for IV pain meds with plan as below  Assessment & Plan:   Active Problems:   Intractable low back pain   Severe low back pain   Intractable low back pain due to Lumbar spine spondylosis: -this is most notable at L4-L5 with a left paracentral/lateral recess disc extrusion contacting and impingingthe descending left L5 nerve root. There is also severe central canal stenosis at this level.   -Received Solu-Medrol x1 in the ER -Placed on prednisone taper   -Continue with muscle relaxants prn  -Continue with supportive IV pain medications/NSAIDs as needed, patient prefers non-opioids  -toradol,ultram, prn.  -PT consult placed -patient still unable to ambulate secondary to severe pain and weakness -Of note patient is followed by Ortho care in [place] with plans for spinal injection December 16  DVT prophylaxis: Heparin SQ  Code Status: full    Code Status Orders  (From admission, onward)         Start     Ordered   12/31/18 0332  Full code  Continuous     12/31/18 0337        Code Status History    This patient has a current code status but no historical code status.   Advance Care Planning Activity     Family Communication: With wife at bedside Disposition Plan:   Patient converted to inpatient secondary to need for IV NSAIDs and pain medications as needed.  Patient unable to ambulate secondary to severe pain and is not safe for discharge home. Consults called: None Admission status: Inpatient   Consultants:   None  Procedures:  Dg Thoracic Spine 2 View  Result Date: 12/30/2018 CLINICAL DATA:  Chronic back pain.  Left leg numbness. EXAM: THORACIC SPINE 2 VIEWS COMPARISON:  None. FINDINGS: There is no evidence of thoracic spine fracture. Alignment is normal. No other significant bone abnormalities are identified. IMPRESSION: Negative. Electronically Signed   By: Rolm Baptise M.D.   On: 12/30/2018 21:40   Dg Lumbar Spine 2-3 Views  Result Date: 12/30/2018 CLINICAL DATA:  Chronic back pain.  Left leg numbness. EXAM: LUMBAR SPINE - 2-3 VIEW COMPARISON:  None. FINDINGS: There is no evidence of lumbar spine fracture. Alignment is normal. Intervertebral disc spaces are maintained. IMPRESSION: Negative. Electronically Signed   By: Rolm Baptise M.D.   On: 12/30/2018 21:40   Mr Thoracic Spine Wo Contrast  Result Date: 12/30/2018 CLINICAL DATA:  Rapidly progressive back pain, prior bulging disc at T11-T12 EXAM: MRI THORACIC SPINE WITHOUT CONTRAST TECHNIQUE: Multiplanar, multisequence MR imaging of the thoracic spine was performed. No intravenous contrast was administered. COMPARISON:  MRI July 10, 2018 FINDINGS: Alignment: Normal Vertebrae: Vertebral body heights  are well maintained. No fracture, marrow edema, or pathologic infiltration. Cord: Normal in signal and morphology. Paraspinal and other soft tissues: Normal appearance to the paraspinal soft tissues and retroperitoneum. Disc levels: T1-T2: No significant canal or neural foraminal narrowing T2-T3: No significant canal or neural  foraminal narrowing T3-T4: No significant canal or neural foraminal narrowing T4-T5: No significant canal or neural foraminal narrowing T5-T6: No significant canal or neural foraminal narrowing T6-T7: No significant canal or neural foraminal narrowing T7-T8: There is a minimal broad-based disc bulge, however no significant canal or neural foraminal narrowing. T8-T9: There is a broad-based disc bulge with a right paracentral disc protrusion which causes mild effacement of the anterior thecal sac T9-T10: No significant canal or neural foraminal narrowing T10-T11: No significant canal or neural foraminal narrowing T11-T12: Again noted is a left paracentral disc protrusion/extrusion and measuring 8 mm in AP dimension which causes effacement of the anterior thecal sac which measures 6 mm in AP dimension. 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 Electronically Signed   By: Jonna ClarkBindu  Avutu M.D.   On: 12/30/2018 23:24   Mr Lumbar Spine Wo Contrast  Result Date: 12/30/2018 CLINICAL DATA:  Back pain worsening over the last 6 weeks radiating into both lower extremities EXAM: MRI LUMBAR SPINE WITHOUT CONTRAST TECHNIQUE: Multiplanar, multisequence MR imaging of the lumbar spine was performed. No intravenous contrast was administered. COMPARISON:  July 10, 2018 FINDINGS: Segmentation: There are 5 non-rib bearing lumbar type vertebral bodies with the last intervertebral disc space labeled as L5-S1. Alignment:  Normal Vertebrae: The vertebral body heights are well maintained. No fracture, marrow edema,or pathologic marrow infiltration. Fatty endplate changes are seen at L1, L3, and L4. Conus medullaris and cauda equina: Conus extends to the L1 level. Conus and cauda equina appear normal. 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 Paraspinal and other soft tissues: The  paraspinal soft tissues and visualized retroperitoneal structures are unremarkable. The sacroiliac joints are intact. Disc levels: T12-L1:  No significant canal or neural foraminal narrowing. L1-L2:   No significant canal or neural foraminal narrowing. L2-L3: There is a minimal broad-based disc bulge with facet arthrosis which causes mild bilateral neural foraminal narrowing there is mild effacement anterior thecal sac. L3-L4: There is a broad-based disc bulge with facet arthrosis and ligamentum flavum hypertrophy which causes mild bilateral neural foraminal narrowing. The central thecal sac is mildly effaced measuring 8 mm in AP dimension. There is prominent epidural lipomatosis. L4-L5: Again noted is a left paracentral/lateral recess disc extrusion heading in the caudal direction measuring 7 mm in AP diameter. This disc extrusion contacts and impinges the descending left L5 nerve root. The central thecal sac is effaced measuring 4 mm in AP dimension. There is moderate bilateral neural foraminal narrowing. There is also prominent epidural lipomatosis. L5-S1: There is a broad-based disc bulge with facet arthrosis which causes moderate right and mild left neural foraminal narrowing. There is prominent epidural lipomatosis. The central thecal sac is effaced measuring 5 mm in AP dimension. 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 There is also severe central canal stenosis at this level. This was seen on the prior exam and not significantly changed. 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   On:  12/30/2018 23:30     Antimicrobials:   None   Subjective: Patient was seen in the ER where he is boarding Still reports severe pain unable to ambulate or support his own  weight  Objective: Vitals:   12/31/18 0630 12/31/18 0700 12/31/18 0859 12/31/18 1307  BP: (!) 143/101  (!) 161/102 (!) 162/95  Pulse: (!) 101  (!) 108 100  Resp: Temp:  98 F (36.7 C)  98.1 F (36.7 C)  TempSrc:  Oral    SpO2: 100%  99% 94%  Weight:      Height:       No intake or output data in the 24 hours ending 12/31/18 1400 Filed Weights   12/30/18 2038  Weight: 124.7 kg    Examination:  General exam: Appears calm but in pain Respiratory system: Clear to auscultation. Respiratory effort normal. Cardiovascular system: S1 & S2 heard, RRR. No JVD, murmurs, rubs, gallops or clicks. No pedal edema. Gastrointestinal system: Abdomen is nondistended, soft and nontender. No organomegaly or masses felt. Normal bowel sounds heard. Central nervous system: Alert and oriented. No focal neurological deficits. Extremities: Very limited exam secondary to severe pain Skin: No rashes, lesions or ulcers Psychiatry: Judgement and insight appear normal. Mood & affect appropriate.     Data Reviewed: I have personally reviewed following labs and imaging studies  CBC: Recent Labs  Lab 12/31/18 0400  WBC 8.4  HGB 15.4  HCT 44.2  MCV 81.1  PLT 252   Basic Metabolic Panel: Recent Labs  Lab 12/31/18 0400  NA 137  K 4.4  CL 101  CO2 26  GLUCOSE 119*  BUN 22*  CREATININE 1.00  CALCIUM 9.6   GFR: Estimated Creatinine Clearance: 132.6 mL/min (by C-G formula based on SCr of 1 mg/dL). Liver Function Tests: Recent Labs  Lab 12/31/18 0400  AST 26  ALT 23  ALKPHOS 46  BILITOT 1.0  PROT 7.5  ALBUMIN 4.4   No results for input(s): LIPASE, AMYLASE in the last 168 hours. No results for input(s): AMMONIA in the last 168 hours. Coagulation Profile: No results for input(s): INR, PROTIME in the last 168 hours. Cardiac Enzymes: No results for input(s): CKTOTAL, CKMB, CKMBINDEX, TROPONINI in the last 168 hours. BNP (last 3 results) No results for input(s): PROBNP in  the last 8760 hours. HbA1C: No results for input(s): HGBA1C in the last 72 hours. CBG: No results for input(s): GLUCAP in the last 168 hours. Lipid Profile: No results for input(s): CHOL, HDL, LDLCALC, TRIG, CHOLHDL, LDLDIRECT in the last 72 hours. Thyroid Function Tests: No results for input(s): TSH, T4TOTAL, FREET4, T3FREE, THYROIDAB in the last 72 hours. Anemia Panel: No results for input(s): VITAMINB12, FOLATE, FERRITIN, TIBC, IRON, RETICCTPCT in the last 72 hours. Sepsis Labs: No results for input(s): PROCALCITON, LATICACIDVEN in the last 168 hours.  No results found for this or any previous visit (from the past 240 hour(s)).       Radiology Studies: Dg Thoracic Spine 2 View  Result Date: 12/30/2018 CLINICAL DATA:  Chronic back pain.  Left leg numbness. EXAM: THORACIC SPINE 2 VIEWS COMPARISON:  None. FINDINGS: There is no evidence of thoracic spine fracture. Alignment is normal. No other significant bone abnormalities are identified. IMPRESSION: Negative. Electronically Signed   By: Charlett Nose M.D.   On: 12/30/2018 21:40   Dg Lumbar Spine 2-3 Views  Result Date: 12/30/2018 CLINICAL DATA:  Chronic back pain.  Left leg numbness. EXAM: LUMBAR SPINE - 2-3  VIEW COMPARISON:  None. FINDINGS: There is no evidence of lumbar spine fracture. Alignment is normal. Intervertebral disc spaces are maintained. IMPRESSION: Negative. Electronically Signed   By: Charlett Nose M.D.   On: 12/30/2018 21:40   Mr Thoracic Spine Wo Contrast  Result Date: 12/30/2018 CLINICAL DATA:  Rapidly progressive back pain, prior bulging disc at T11-T12 EXAM: MRI THORACIC SPINE WITHOUT CONTRAST TECHNIQUE: Multiplanar, multisequence MR imaging of the thoracic spine was performed. No intravenous contrast was administered. COMPARISON:  MRI July 10, 2018 FINDINGS: Alignment: Normal Vertebrae: Vertebral body heights are well maintained. No fracture, marrow edema, or pathologic infiltration. Cord: Normal in signal and  morphology. Paraspinal and other soft tissues: Normal appearance to the paraspinal soft tissues and retroperitoneum. Disc levels: T1-T2: No significant canal or neural foraminal narrowing T2-T3: No significant canal or neural foraminal narrowing T3-T4: No significant canal or neural foraminal narrowing T4-T5: No significant canal or neural foraminal narrowing T5-T6: No significant canal or neural foraminal narrowing T6-T7: No significant canal or neural foraminal narrowing T7-T8: There is a minimal broad-based disc bulge, however no significant canal or neural foraminal narrowing. T8-T9: There is a broad-based disc bulge with a right paracentral disc protrusion which causes mild effacement of the anterior thecal sac T9-T10: No significant canal or neural foraminal narrowing T10-T11: No significant canal or neural foraminal narrowing T11-T12: Again noted is a left paracentral disc protrusion/extrusion and measuring 8 mm in AP dimension which causes effacement of the anterior thecal sac which measures 6 mm in AP dimension. 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 Electronically Signed   By: Jonna Clark M.D.   On: 12/30/2018 23:24   Mr Lumbar Spine Wo Contrast  Result Date: 12/30/2018 CLINICAL DATA:  Back pain worsening over the last 6 weeks radiating into both lower extremities EXAM: MRI LUMBAR SPINE WITHOUT CONTRAST TECHNIQUE: Multiplanar, multisequence MR imaging of the lumbar spine was performed. No intravenous contrast was administered. COMPARISON:  July 10, 2018 FINDINGS: Segmentation: There are 5 non-rib bearing lumbar type vertebral bodies with the last intervertebral disc space labeled as L5-S1. Alignment:  Normal Vertebrae: The vertebral body heights are well maintained. No fracture, marrow edema,or pathologic marrow infiltration. Fatty endplate changes are seen at L1, L3, and L4. Conus medullaris and  cauda equina: Conus extends to the L1 level. Conus and cauda equina appear normal. 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 Paraspinal and other soft tissues: The paraspinal soft tissues and visualized retroperitoneal structures are unremarkable. The sacroiliac joints are intact. Disc levels: T12-L1:  No significant canal or neural foraminal narrowing. L1-L2:   No significant canal or neural foraminal narrowing. L2-L3: There is a minimal broad-based disc bulge with facet arthrosis which causes mild bilateral neural foraminal narrowing there is mild effacement anterior thecal sac. L3-L4: There is a broad-based disc bulge with facet arthrosis and ligamentum flavum hypertrophy which causes mild bilateral neural foraminal narrowing. The central thecal sac is mildly effaced measuring 8 mm in AP dimension. There is prominent epidural lipomatosis. L4-L5: Again noted is a left paracentral/lateral recess disc extrusion heading in the caudal direction measuring 7 mm in AP diameter. This disc extrusion contacts and impinges the descending left L5 nerve root. The central thecal sac is effaced measuring 4 mm in AP dimension. There is moderate bilateral neural foraminal narrowing. There is also prominent epidural lipomatosis. L5-S1: There is a broad-based disc bulge with facet arthrosis  which causes moderate right and mild left neural foraminal narrowing. There is prominent epidural lipomatosis. The central thecal sac is effaced measuring 5 mm in AP dimension. 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 There is also severe central canal stenosis at this level. This was seen on the prior exam and not significantly changed. 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   On: 12/30/2018 23:30        Scheduled Meds:  heparin  5,000 Units Subcutaneous Q8H   pantoprazole  20 mg Oral Daily   predniSONE  10 mg Oral PC supper   [START ON 01/01/2019] predniSONE  10 mg Oral 3 x daily with food   [START ON 01/02/2019] predniSONE  10 mg Oral 4X daily taper   predniSONE  20 mg Oral Nightly   [START ON 01/01/2019] predniSONE  20 mg Oral Nightly   Continuous Infusions:   LOS: 0 days    Time spent: 35 min    Burke Keels, MD [facility]  If 7PM-7AM, please contact night-coverage  12/31/2018, 2:00 PM

## 2018-12-31 NOTE — ED Notes (Signed)
Patient's visitor leaving. RN updated her on visitation policies; she verbalized understanding and left.

## 2018-12-31 NOTE — ED Notes (Signed)
Pt moved from hospital bed to ed stretcher at pts request (bed was too soft). Pt had previously tried to sit in recliner with similar results.

## 2018-12-31 NOTE — Evaluation (Signed)
Physical Therapy Evaluation Patient Details Name: Kyle Russell MRN: [id number] DOB: [birth year] Today's Date: 12/31/2018   History of Present Illness  Per MD note: Pt is a 43 y.o. male with medical history significant of chronic lower back pain x1 year by orthopedics with known bulging disc in the lower thoracic and lumbar spine who presents to the ED with intractable back pain.   MD assessment includes: Thoracic spine spondylosis most notable at T11-T12 with a leftparacentral protrusion/extrusion which causes moderate canal stenosis and Lumbar spine spondylosis most notable at L4-L5 with a left paracentral/lateral recess disc extrusion contacting and impinging the descending left L5 nerve root. There is also severe central canal stenosis at this level.    Clinical Impression  Pt presented with deficits in strength, transfers, mobility, gait, balance, and activity tolerance and was limited during the session by low back pain radiating down the entire length of the LLE.  Pt found lying in prone for comfort.  Pt educated on log roll training requiring cues for sequencing but no physical assistance.  Pt was able to stand to a RW with increased effort and cues for sequencing but did not require physical assistance.  Once in standing to pt stood with a flexed trunk posture that when corrected to upright standing increased his low back and LLE symptoms with pain reported as 10/10.  The pt could briefly and minimally lift the L and R foot from the floor with heavy lean on the RW but stated that he could not take a step forward secondary to increased low back pain.  Once pt returned to prone the pt reported his pain quickly returned to baseline of 6-7/10.  Pt will benefit from HHPT services upon discharge to safely address above deficits for decreased caregiver assistance and eventual return to PLOF.      Follow Up Recommendations Home health PT;Supervision - Intermittent    Equipment Recommendations   Rolling walker with 5" wheels    Recommendations for Other Services       Precautions / Restrictions Precautions Precautions: None Restrictions Weight Bearing Restrictions: No      Mobility  Bed Mobility Overal bed mobility: Needs Assistance Bed Mobility: Rolling;Sidelying to Sit;Sit to Sidelying Rolling: Supervision Sidelying to sit: Supervision     Sit to sidelying: Supervision General bed mobility comments: Log roll technique education provided for decreased pain with bed mobility  Transfers Overall transfer level: Needs assistance Equipment used: Rolling walker (2 wheeled) Transfers: Sit to/from Stand Sit to Stand: Min guard         General transfer comment: Min verbal cues for general sequencing  Ambulation/Gait             General Gait Details: Pt able to lift each LE briefly and with small amplitude movements but was unable to ambulate secondary to increased back pain with attempt  Stairs            Wheelchair Mobility    Modified Rankin (Stroke Patients Only)       Balance Overall balance assessment: Needs assistance Sitting-balance support: Feet supported;Bilateral upper extremity supported Sitting balance-Leahy Scale: Good     Standing balance support: Bilateral upper extremity supported Standing balance-Leahy Scale: Fair Standing balance comment: Heavy lean on the RW in standing                             Pertinent Vitals/Pain Pain Assessment: 0-10 Pain Score: 7  Pain Location: Low  back radiating down L leg Pain Descriptors / Indicators: Radiating;Sharp Pain Intervention(s): Premedicated before session;Monitored during session;Limited activity within patient's tolerance    Home Living Family/patient expects to be discharged to:: Private residence Living Arrangements: Spouse/significant other;Children Available Help at Discharge: Family;Available 24 hours/day Type of Home: House Home Access: Ramped entrance      Home Layout: Two level;Able to live on main level with bedroom/bathroom Home Equipment: None Additional Comments: Pt will be borrowing a transport chair from family    Prior Function Level of Independence: Independent         Comments: Ind Amb community distances without an AD, no fall history, Ind with ADLs     Hand Dominance        Extremity/Trunk Assessment   Upper Extremity Assessment Upper Extremity Assessment: Overall WFL for tasks assessed    Lower Extremity Assessment Lower Extremity Assessment: Generalized weakness;RLE deficits/detail;LLE deficits/detail RLE Deficits / Details: BLE manual muscle testing limited secondary to increased back and radiating LLE pain with testing RLE: Unable to fully assess due to pain LLE Deficits / Details: BLE manual muscle testing limited secondary to increased back and radiating LLE pain with testing LLE: Unable to fully assess due to pain LLE Sensation: decreased light touch       Communication   Communication: No difficulties  Cognition Arousal/Alertness: Awake/alert Behavior During Therapy: WFL for tasks assessed/performed Overall Cognitive Status: Within Functional Limits for tasks assessed                                        General Comments      Exercises Other Exercises Other Exercises: Log roll technique training with pt and spouse Other Exercises: Car transfer sequencing demonstration with verbal and visual cues using a room chair to simulate car seat   Assessment/Plan    PT Assessment Patient needs continued PT services  PT Problem List Decreased strength;Decreased activity tolerance;Decreased mobility;Decreased knowledge of use of DME;Decreased balance       PT Treatment Interventions DME instruction;Gait training;Functional mobility training;Therapeutic activities;Therapeutic exercise;Balance training;Patient/family education    PT Goals (Current goals can be found in the Care Plan  section)  Acute Rehab PT Goals Patient Stated Goal: Move without pain PT Goal Formulation: With patient Time For Goal Achievement: 01/13/19 Potential to Achieve Goals: Good    Frequency Min 2X/week   Barriers to discharge        Co-evaluation               AM-PAC PT "6 Clicks" Mobility  Outcome Measure Help needed turning from your back to your side while in a flat bed without using bedrails?: A Little Help needed moving from lying on your back to sitting on the side of a flat bed without using bedrails?: A Little Help needed moving to and from a bed to a chair (including a wheelchair)?: A Lot Help needed standing up from a chair using your arms (e.g., wheelchair or bedside chair)?: A Little Help needed to walk in hospital room?: A Lot Help needed climbing 3-5 steps with a railing? : Total 6 Click Score: 14    End of Session Equipment Utilized During Treatment: Gait belt Activity Tolerance: Patient limited by pain Patient left: in bed;with family/visitor present;with call bell/phone within reach Nurse Communication: Mobility status PT Visit Diagnosis: Pain;Muscle weakness (generalized) (M62.81);Difficulty in walking, not elsewhere classified (R26.2) Pain - Right/Left: Left Pain -  part of body: Leg(and low back)    Time: 5953-9672 PT Time Calculation (min) (ACUTE ONLY): 37 min   Charges:   PT Evaluation $PT Eval Moderate Complexity: 1 Mod PT Treatments $Therapeutic Activity: 8-22 mins        D. Royetta Asal PT, DPT 12/31/18, 12:41 PM

## 2019-01-01 ENCOUNTER — Other Ambulatory Visit: Payer: Self-pay | Admitting: Orthopaedic Surgery

## 2019-01-01 DIAGNOSIS — G8929 Other chronic pain: Secondary | ICD-10-CM

## 2019-01-01 DIAGNOSIS — M545 Low back pain: Secondary | ICD-10-CM

## 2019-01-01 DIAGNOSIS — M5442 Lumbago with sciatica, left side: Secondary | ICD-10-CM

## 2019-01-01 DIAGNOSIS — M5126 Other intervertebral disc displacement, lumbar region: Secondary | ICD-10-CM

## 2019-01-01 DIAGNOSIS — M5136 Other intervertebral disc degeneration, lumbar region: Secondary | ICD-10-CM

## 2019-01-01 LAB — PROTIME-INR
INR: 1 (ref 0.8–1.2)
Prothrombin Time: 13.3 seconds (ref 11.4–15.2)

## 2019-01-01 LAB — APTT: aPTT: 28 seconds (ref 24–36)

## 2019-01-01 NOTE — Telephone Encounter (Signed)
Please see below. Patient was a no show for injection appointment in June and did not respond to calls to reschedule until November. He is on the wait list, but we have had no cancellations. [facility] might be able to get him in sooner.

## 2019-01-01 NOTE — Progress Notes (Signed)
PROGRESS NOTE    Kyle Russell  YBO:[id number] DOB: 19-Jan-1976 DOA: 12/30/2018 PCP: Idelle Crouch, MD (Confirm with patient/family/NH records and if not entered, this HAS to be entered at [facility] point of entry. "No PCP" if truly none.)   Brief Narrative:  43 year old male with known chronic lower back pain x1 year followed as outpatient by orthopedics.  Present to the ED with acute on chronic intractable back pain.  Treated in the ED without significant relief.  Due to inability to ambulate and continued intractable back pain patient was admitted for observation to the hospitalist service  Patient able to ambulate with a walker.  Reluctant to use narcotic pain medication.  Reports minimal if any relief on current medication regimen.   Assessment & Plan:   Active Problems:   Intractable low back pain   Severe low back pain  Intractable back pain L4/L5 disc disease -Currently on prednisone taper with minimal relief -Continue muscle relaxants as needed -Continue multimodal pain regimen, avoid narcotics -PT consult placed, recommendations appreciated -Given patient's persistent complaints will consult neurosurgery for discussion regarding any possible surgical intervention  Stage II Obesity (BMI 37.3) Counsel patient Nutrition consult    DVT prophylaxis: SQH Code Status: FULL Family Communication: last conversation with wife at bedside 11/30 Disposition Plan: Home  Consultants:   Neurosurgery   Procedures: None  Antimicrobials: none  Subjective: Seen and examined No acute events Continues to endorse back pain Minimal response to medication regimen  Objective: Vitals:   12/31/18 1900 12/31/18 1905 12/31/18 2229 01/01/19 0843  BP:  138/86 (!) 162/95 (!) 142/84  Pulse:  (!) 102 99 98  Resp:   16 16  Temp:   98.5 F (36.9 C) 97.8 F (36.6 C)  TempSrc:   Oral Oral  SpO2: 94% 99% 97% 98%  Weight:      Height:        Intake/Output Summary (Last 24 hours)  at 01/01/2019 1533 Last data filed at 01/01/2019 1300 Gross per 24 hour  Intake 480 ml  Output --  Net 480 ml   Filed Weights   12/30/18 2038  Weight: 124.7 kg    Examination:  General exam: Appears calm and comfortable  Respiratory system: Clear to auscultation. Respiratory effort normal. Cardiovascular system: S1 & S2 heard, RRR. No JVD, murmurs, rubs, gallops or clicks. No pedal edema. Gastrointestinal system: Abdomen is nondistended, soft and nontender. No organomegaly or masses felt. Normal bowel sounds heard. Central nervous system: Alert and oriented. No focal neurological deficits. Extremities: Decreased strength bilateral lower extremities secondary to pain Skin: No rashes, lesions or ulcers Psychiatry: Judgement and insight appear normal. Mood & affect appropriate.     Data Reviewed: I have personally reviewed following labs and imaging studies  CBC: Recent Labs  Lab 12/31/18 0400  WBC 8.4  HGB 15.4  HCT 44.2  MCV 81.1  PLT 277   Basic Metabolic Panel: Recent Labs  Lab 12/31/18 0400  NA 137  K 4.4  CL 101  CO2 26  GLUCOSE 119*  BUN 22*  CREATININE 1.00  CALCIUM 9.6   GFR: Estimated Creatinine Clearance: 132.6 mL/min (by C-G formula based on SCr of 1 mg/dL). Liver Function Tests: Recent Labs  Lab 12/31/18 0400  AST 26  ALT 23  ALKPHOS 46  BILITOT 1.0  PROT 7.5  ALBUMIN 4.4   No results for input(s): LIPASE, AMYLASE in the last 168 hours. No results for input(s): AMMONIA in the last 168 hours. Coagulation Profile: No  results for input(s): INR, PROTIME in the last 168 hours. Cardiac Enzymes: No results for input(s): CKTOTAL, CKMB, CKMBINDEX, TROPONINI in the last 168 hours. BNP (last 3 results) No results for input(s): PROBNP in the last 8760 hours. HbA1C: No results for input(s): HGBA1C in the last 72 hours. CBG: No results for input(s): GLUCAP in the last 168 hours. Lipid Profile: No results for input(s): CHOL, HDL, LDLCALC, TRIG,  CHOLHDL, LDLDIRECT in the last 72 hours. Thyroid Function Tests: No results for input(s): TSH, T4TOTAL, FREET4, T3FREE, THYROIDAB in the last 72 hours. Anemia Panel: No results for input(s): VITAMINB12, FOLATE, FERRITIN, TIBC, IRON, RETICCTPCT in the last 72 hours. Sepsis Labs: No results for input(s): PROCALCITON, LATICACIDVEN in the last 168 hours.  Recent Results (from the past 240 hour(s))  SARS CORONAVIRUS 2 (TAT 6-24 HRS) Nasopharyngeal Nasopharyngeal Swab     Status: None   Collection Time: 12/31/18  1:47 AM   Specimen: Nasopharyngeal Swab  Result Value Ref Range Status   SARS Coronavirus 2 NEGATIVE NEGATIVE Final    Comment: (NOTE) SARS-CoV-2 target nucleic acids are NOT DETECTED. The SARS-CoV-2 RNA is generally detectable in upper and lower respiratory specimens during the acute phase of infection. Negative results do not preclude SARS-CoV-2 infection, do not rule out co-infections with other pathogens, and should not be used as the sole basis for treatment or other patient management decisions. Negative results must be combined with clinical observations, patient history, and epidemiological information. The expected result is Negative. Fact Sheet for Patients: HairSlick.no Fact Sheet for Healthcare Providers: quierodirigir.com This test is not yet approved or cleared by the [place] FDA and  has been authorized for detection and/or diagnosis of SARS-CoV-2 by FDA under an Emergency Use Authorization (EUA). This EUA will remain  in effect (meaning this test can be used) for the duration of the COVID-19 declaration under Section 56 4(b)(1) of the Act, 21 U.S.C. section 360bbb-3(b)(1), unless the authorization is terminated or revoked sooner. Performed at [facility], [address] [place], [place] [address]          Radiology Studies: Dg Thoracic Spine 2 View  Result Date: 12/30/2018 CLINICAL  DATA:  Chronic back pain.  Left leg numbness. EXAM: THORACIC SPINE 2 VIEWS COMPARISON:  None. FINDINGS: There is no evidence of thoracic spine fracture. Alignment is normal. No other significant bone abnormalities are identified. IMPRESSION: Negative. Electronically Signed   By: Charlett Nose M.D.   On: 12/30/2018 21:40   Dg Lumbar Spine 2-3 Views  Result Date: 12/30/2018 CLINICAL DATA:  Chronic back pain.  Left leg numbness. EXAM: LUMBAR SPINE - 2-3 VIEW COMPARISON:  None. FINDINGS: There is no evidence of lumbar spine fracture. Alignment is normal. Intervertebral disc spaces are maintained. IMPRESSION: Negative. Electronically Signed   By: Charlett Nose M.D.   On: 12/30/2018 21:40   Mr Thoracic Spine Wo Contrast  Result Date: 12/30/2018 CLINICAL DATA:  Rapidly progressive back pain, prior bulging disc at T11-T12 EXAM: MRI THORACIC SPINE WITHOUT CONTRAST TECHNIQUE: Multiplanar, multisequence MR imaging of the thoracic spine was performed. No intravenous contrast was administered. COMPARISON:  MRI July 10, 2018 FINDINGS: Alignment: Normal Vertebrae: Vertebral body heights are well maintained. No fracture, marrow edema, or pathologic infiltration. Cord: Normal in signal and morphology. Paraspinal and other soft tissues: Normal appearance to the paraspinal soft tissues and retroperitoneum. Disc levels: T1-T2: No significant canal or neural foraminal narrowing T2-T3: No significant canal or neural foraminal narrowing T3-T4: No significant canal or neural  foraminal narrowing T4-T5: No significant canal or neural foraminal narrowing T5-T6: No significant canal or neural foraminal narrowing T6-T7: No significant canal or neural foraminal narrowing T7-T8: There is a minimal broad-based disc bulge, however no significant canal or neural foraminal narrowing. T8-T9: There is a broad-based disc bulge with a right paracentral disc protrusion which causes mild effacement of the anterior thecal sac T9-T10: No significant  canal or neural foraminal narrowing T10-T11: No significant canal or neural foraminal narrowing T11-T12: Again noted is a left paracentral disc protrusion/extrusion and measuring 8 mm in AP dimension which causes effacement of the anterior thecal sac which measures 6 mm in AP dimension. 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 Electronically Signed   By: Jonna ClarkBindu  Avutu M.D.   On: 12/30/2018 23:24   Mr Lumbar Spine Wo Contrast  Result Date: 12/30/2018 CLINICAL DATA:  Back pain worsening over the last 6 weeks radiating into both lower extremities EXAM: MRI LUMBAR SPINE WITHOUT CONTRAST TECHNIQUE: Multiplanar, multisequence MR imaging of the lumbar spine was performed. No intravenous contrast was administered. COMPARISON:  July 10, 2018 FINDINGS: Segmentation: There are 5 non-rib bearing lumbar type vertebral bodies with the last intervertebral disc space labeled as L5-S1. Alignment:  Normal Vertebrae: The vertebral body heights are well maintained. No fracture, marrow edema,or pathologic marrow infiltration. Fatty endplate changes are seen at L1, L3, and L4. Conus medullaris and cauda equina: Conus extends to the L1 level. Conus and cauda equina appear normal. 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 Paraspinal and other soft tissues: The paraspinal soft tissues and visualized retroperitoneal structures are unremarkable. The sacroiliac joints are intact. Disc levels: T12-L1:  No significant canal or neural foraminal narrowing. L1-L2:   No significant canal or neural foraminal narrowing. L2-L3: There is a minimal broad-based disc bulge with facet arthrosis which causes mild bilateral neural foraminal narrowing there is mild effacement anterior thecal sac. L3-L4: There is a broad-based disc bulge with facet arthrosis and ligamentum flavum hypertrophy which causes  mild bilateral neural foraminal narrowing. The central thecal sac is mildly effaced measuring 8 mm in AP dimension. There is prominent epidural lipomatosis. L4-L5: Again noted is a left paracentral/lateral recess disc extrusion heading in the caudal direction measuring 7 mm in AP diameter. This disc extrusion contacts and impinges the descending left L5 nerve root. The central thecal sac is effaced measuring 4 mm in AP dimension. There is moderate bilateral neural foraminal narrowing. There is also prominent epidural lipomatosis. L5-S1: There is a broad-based disc bulge with facet arthrosis which causes moderate right and mild left neural foraminal narrowing. There is prominent epidural lipomatosis. The central thecal sac is effaced measuring 5 mm in AP dimension. 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 There is also severe central canal stenosis at this level. This was seen on the prior exam and not significantly changed. 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   On: 12/30/2018 23:30        Scheduled Meds:  heparin  5,000 Units Subcutaneous Q8H   pantoprazole  20 mg Oral Daily   predniSONE  10 mg Oral 3 x daily with food   [START ON 01/02/2019] predniSONE  10 mg Oral 4X daily taper   predniSONE  20 mg Oral Nightly   Continuous Infusions:   LOS: 1 day    Time spent: >30 minutes    Tresa Moore, MD [facility] Pager [phone]  If 7PM-7AM, please contact night-coverage [email or web address] Password [facility] 01/01/2019, 3:33 PM

## 2019-01-01 NOTE — Consult Note (Signed)
Neurosurgery-New Consultation Evaluation 01/01/2019 Kyle RalphsBradley S Buzan [id number]  Identifying Statement: Kyle Russell is a 43 y[place]o. [place] [id number] with back and left leg pain  Physician Requesting Consultation: Lolita PatellaSudheer Sreenath, MD  History of Present Illness: [name] is admitted for severe back and left leg pain.  He states that he had the symptoms started approximately a year ago when he was being seen by an outside spine provider and was starting therapy with medications and the pain resolved.  He was scheduled for an injection but canceled it given the resolution of the symptoms.  He states that approximately 2 to 3 months ago the pain started recurring and will go down the lateral and posterior side of the left leg to the foot.  He does endorse some numbness on the bottom of her foot which will be intermittent with the pain.  He has noticed some weakness in his left leg and foot but is unsure if this is compensation for the pain.  3 days ago, he was doing some stretching at home when he noticed a sharp increase sensation in the left leg which prompted the emergency department evaluation.  Given his inability to stand and walk with the pain he was admitted and is currently undergoing a steroid taper.  He does state the pain is improved slightly.  He has not been able to get up and move around a significant amount.  He denies any right leg symptoms.  He does have an injection scheduled as an outpatient next Monday.  Past Medical History:  History reviewed. No pertinent past medical history.  Social History: Social History   Socioeconomic History  . Marital status: Married    Spouse name: Not on file  . Number of children: Not on file  . Years of education: Not on file  . Highest education level: Not on file  Occupational History  . Not on file  Social Needs  . Financial resource strain: Not on file  . Food insecurity    Worry: Not on file    Inability: Not on  file  . Transportation needs    Medical: Not on file    Non-medical: Not on file  Tobacco Use  . Smoking status: Never Smoker  . Smokeless tobacco: Never Used  Substance and Sexual Activity  . Alcohol use: No  . Drug use: No  . Sexual activity: Yes  Lifestyle  . Physical activity    Days per week: Not on file    Minutes per session: Not on file  . Stress: Not on file  Other Topics Concern  . Not on file  Social History Narrative  . Not on file   Living arrangements (living alone, with partner): Spouse is in room  Family History: History reviewed. No pertinent family history.  Review of Systems:  Review of Systems - General ROS: Negative Psychological ROS: Negative Ophthalmic ROS: Negative ENT ROS: Negative Hematological and Lymphatic ROS: Negative  Endocrine ROS: Negative Respiratory ROS: Negative Cardiovascular ROS: Negative Gastrointestinal ROS: Negative Genito-Urinary ROS: Negative Musculoskeletal ROS: Positive for back pain Neurological ROS: Positive for left leg pain, numbness Dermatological ROS: Negative  Physical Exam: BP (!) 142/84 (BP Location: Left Arm)   Pulse 98   Temp 97.8 F (36.6 C) (Oral)   Resp 16   Ht 6' (1.829 m)   Wt 124.7 kg   SpO2 98%   BMI 37.30 kg/m  Body mass index is 37.3 kg/m. Body surface area is 2.52 meters  squared. General appearance: Alert, cooperative, appears uncomfortable Head: Normocephalic, atraumatic Ext: No edema in LE bilaterally, warm extremities  Neurologic exam:  Mental status: alertness: alert, affect: normal Speech: fluent and clear Motor:strength symmetric 5/5 in bilateral hip flexion, knee flexion, knee extension, plantarflexion, he is 5 out of 5 in right dorsiflexion and 4-5 in left dorsiflexion Sensory: intact to light touch in bilateral lower extremities Gait: Not tested given pain  Laboratory: Results for orders placed or performed during the hospital encounter of 12/30/18  SARS CORONAVIRUS 2 (TAT  6-24 HRS) Nasopharyngeal Nasopharyngeal Swab   Specimen: Nasopharyngeal Swab  Result Value Ref Range   SARS Coronavirus 2 NEGATIVE NEGATIVE  HIV Antibody (routine testing w rflx)  Result Value Ref Range   HIV Screen 4th Generation wRfx NON REACTIVE NON REACTIVE  CBC  Result Value Ref Range   WBC 8.4 4.0 - 10.5 K/uL   RBC 5.45 4.22 - 5.81 MIL/uL   Hemoglobin 15.4 13.0 - 17.0 g/dL   HCT 44.2 39.0 - 52.0 %   MCV 81.1 80.0 - 100.0 fL   MCH 28.3 26.0 - 34.0 pg   MCHC 34.8 30.0 - 36.0 g/dL   RDW 12.1 11.5 - 15.5 %   Platelets 252 150 - 400 K/uL   nRBC 0.0 0.0 - 0.2 %  Comprehensive metabolic panel  Result Value Ref Range   Sodium 137 135 - 145 mmol/L   Potassium 4.4 3.5 - 5.1 mmol/L   Chloride 101 98 - 111 mmol/L   CO2 26 22 - 32 mmol/L   Glucose, Bld 119 (H) 70 - 99 mg/dL   BUN 22 (H) 6 - 20 mg/dL   Creatinine, Ser 1.00 0.61 - 1.24 mg/dL   Calcium 9.6 8.9 - 10.3 mg/dL   Total Protein 7.5 6.5 - 8.1 g/dL   Albumin 4.4 3.5 - 5.0 g/dL   AST 26 15 - 41 U/L   ALT 23 0 - 44 U/L   Alkaline Phosphatase 46 38 - 126 U/L   Total Bilirubin 1.0 0.3 - 1.2 mg/dL   GFR calc non Af Amer >60 >60 mL/min   GFR calc Af Amer >60 >60 mL/min   Anion gap 10 5 - 15  Urinalysis, Routine w reflex microscopic  Result Value Ref Range   Color, Urine YELLOW (A) YELLOW   APPearance CLEAR (A) CLEAR   Specific Gravity, Urine 1.018 1.005 - 1.030   pH 5.0 5.0 - 8.0   Glucose, UA NEGATIVE NEGATIVE mg/dL   Hgb urine dipstick NEGATIVE NEGATIVE   Bilirubin Urine NEGATIVE NEGATIVE   Ketones, ur 20 (A) NEGATIVE mg/dL   Protein, ur NEGATIVE NEGATIVE mg/dL   Nitrite NEGATIVE NEGATIVE   Leukocytes,Ua NEGATIVE NEGATIVE   RBC / HPF 0-5 0 - 5 RBC/hpf   WBC, UA 0-5 0 - 5 WBC/hpf   Bacteria, UA NONE SEEN NONE SEEN   Squamous Epithelial / LPF 0-5 0 - 5   Mucus PRESENT    I personally reviewed labs  Imaging: MRI lumbar spine: There is a normal lordotic curvature with overall normal alignment.  Disc space heights  remain well preserved and there is some mild degeneration at the L4-5 disc.  There is a large disc herniation at this level eccentric to the left which is migrated slightly inferiorly.  This does result in severe lateral recess stenosis at this level on the left.  He does have also epidural lipomatosis which causes central stenosis from L4-S1.   Impression/Plan:  Mr. Heist is admitted  for severe back and leg pain and appears to have a left L5 and possible S1 component radiculopathy.  I reviewed the imaging with him and his spouse and it does show a large disc fragment on the left at the L4-5 level.  We discussed the natural history of these and that we could consider continuing conservative measures with the planned epidural injection, medication, and therapy.  Given the duration of the symptoms associated with the weakness on exam, I did talk with him about surgery which would consist of a left L4-5 hemilaminectomy and discectomy.  We talked about the risk of surgery including CSF leak, nerve damage, hematoma, infection, need for additional surgery in the future, and risk of anesthesia.  At this point, I do not see any emergent intervention and I do think either option is reasonable.  The patient would like to think about the surgical option and I told him to let the team know as there is a chance we could proceed either tomorrow or next Monday.   1.  Diagnosis: Left L5 radiculopathy  2.  Plan -Surgical versus conservative therapies discussed, patient will let us know how to proceed

## 2019-01-01 NOTE — Telephone Encounter (Signed)
I tried to call patient. No answer.  I have entered an order for [facility]. Maybe they can get him in sooner. Just an FYI. Thanks!!

## 2019-01-02 ENCOUNTER — Inpatient Hospital Stay: Payer: Managed Care, Other (non HMO) | Admitting: Certified Registered"

## 2019-01-02 ENCOUNTER — Encounter
Admission: EM | Disposition: A | Discharge status: Home / Self Care | Payer: Self-pay | Source: Home / Self Care | Attending: Internal Medicine

## 2019-01-02 ENCOUNTER — Inpatient Hospital Stay: Payer: Managed Care, Other (non HMO)

## 2019-01-02 ENCOUNTER — Encounter: Payer: Self-pay | Admitting: *Deleted

## 2019-01-02 ENCOUNTER — Other Ambulatory Visit: Payer: Self-pay

## 2019-01-02 DIAGNOSIS — Z419 Encounter for procedure for purposes other than remedying health state, unspecified: Secondary | ICD-10-CM

## 2019-01-02 HISTORY — PX: LUMBAR LAMINECTOMY/DECOMPRESSION MICRODISCECTOMY: SHX5026

## 2019-01-02 LAB — CBC WITH DIFFERENTIAL/PLATELET
Abs Immature Granulocytes: 0.03 10*3/uL (ref 0.00–0.07)
Basophils Absolute: 0 10*3/uL (ref 0.0–0.1)
Basophils Relative: 0 %
Eosinophils Absolute: 0 10*3/uL (ref 0.0–0.5)
Eosinophils Relative: 0 %
HCT: 44 % (ref 39.0–52.0)
Hemoglobin: 15.3 g/dL (ref 13.0–17.0)
Immature Granulocytes: 0 %
Lymphocytes Relative: 27 %
Lymphs Abs: 2.1 10*3/uL (ref 0.7–4.0)
MCH: 28 pg (ref 26.0–34.0)
MCHC: 34.8 g/dL (ref 30.0–36.0)
MCV: 80.6 fL (ref 80.0–100.0)
Monocytes Absolute: 0.6 10*3/uL (ref 0.1–1.0)
Monocytes Relative: 8 %
Neutro Abs: 5.2 10*3/uL (ref 1.7–7.7)
Neutrophils Relative %: 65 %
Platelets: 257 10*3/uL (ref 150–400)
RBC: 5.46 MIL/uL (ref 4.22–5.81)
RDW: 12.4 % (ref 11.5–15.5)
WBC: 8 10*3/uL (ref 4.0–10.5)
nRBC: 0 % (ref 0.0–0.2)

## 2019-01-02 LAB — BASIC METABOLIC PANEL
Anion gap: 9 (ref 5–15)
BUN: 48 mg/dL — ABNORMAL HIGH (ref 6–20)
CO2: 26 mmol/L (ref 22–32)
Calcium: 9.4 mg/dL (ref 8.9–10.3)
Chloride: 103 mmol/L (ref 98–111)
Creatinine, Ser: 1.17 mg/dL (ref 0.61–1.24)
GFR calc Af Amer: >60 mL/min (ref >60)
GFR calc non Af Amer: >60 mL/min (ref >60)
Glucose, Bld: 95 mg/dL (ref 70–99)
Potassium: 3.9 mmol/L (ref 3.5–5.1)
Sodium: 138 mmol/L (ref 135–145)

## 2019-01-02 LAB — SURGICAL PCR SCREEN
MRSA, PCR: NEGATIVE
Staphylococcus aureus: NEGATIVE

## 2019-01-02 SURGERY — LUMBAR LAMINECTOMY/DECOMPRESSION MICRODISCECTOMY 1 LEVEL
Anesthesia: General | Site: Back | Laterality: Left

## 2019-01-02 MED ORDER — TRAMADOL HCL 50 MG PO TABS: 50.0000 mg | ORAL_TABLET | ORAL | 0 refills | Status: AC | PRN

## 2019-01-02 MED ORDER — BUPIVACAINE HCL (PF) 0.5 % IJ SOLN
INTRAMUSCULAR | Status: AC
  Filled 2019-01-02: qty 60

## 2019-01-02 MED ORDER — LIDOCAINE HCL (PF) 2 % IJ SOLN
INTRAMUSCULAR | Status: AC
  Filled 2019-01-02: qty 10

## 2019-01-02 MED ORDER — BUPIVACAINE LIPOSOME 1.3 % IJ SUSP
INTRAMUSCULAR | Status: AC
  Filled 2019-01-02: qty 20

## 2019-01-02 MED ORDER — HYDROMORPHONE HCL 1 MG/ML IJ SOLN
INTRAMUSCULAR | Status: AC
  Administered 2019-01-02: 0.25 mg via INTRAVENOUS
  Filled 2019-01-02: qty 1

## 2019-01-02 MED ORDER — BUPIVACAINE HCL 0.5 % IJ SOLN
INTRAMUSCULAR | Status: DC | PRN
  Administered 2019-01-02: 20 mL

## 2019-01-02 MED ORDER — MIDAZOLAM HCL 2 MG/2ML IJ SOLN
INTRAMUSCULAR | Status: DC | PRN
  Administered 2019-01-02: 2 mg via INTRAVENOUS

## 2019-01-02 MED ORDER — ROCURONIUM BROMIDE 100 MG/10ML IV SOLN
INTRAVENOUS | Status: DC | PRN
  Administered 2019-01-02: 15 mg via INTRAVENOUS
  Administered 2019-01-02: 5 mg via INTRAVENOUS

## 2019-01-02 MED ORDER — SODIUM CHLORIDE 0.9 % IR SOLN
Status: DC | PRN
  Administered 2019-01-02: 1000 mL

## 2019-01-02 MED ORDER — THROMBIN 5000 UNITS EX SOLR
CUTANEOUS | Status: DC | PRN
  Administered 2019-01-02: 5000 [IU] via TOPICAL

## 2019-01-02 MED ORDER — SODIUM CHLORIDE FLUSH 0.9 % IV SOLN
INTRAVENOUS | Status: AC
  Filled 2019-01-02: qty 30

## 2019-01-02 MED ORDER — LABETALOL HCL 5 MG/ML IV SOLN
INTRAVENOUS | Status: AC
  Administered 2019-01-02: 5 mg via INTRAVENOUS
  Filled 2019-01-02: qty 4

## 2019-01-02 MED ORDER — FENTANYL CITRATE (PF) 100 MCG/2ML IJ SOLN
INTRAMUSCULAR | Status: AC
  Filled 2019-01-02: qty 2

## 2019-01-02 MED ORDER — SUCCINYLCHOLINE CHLORIDE 20 MG/ML IJ SOLN
INTRAMUSCULAR | Status: DC | PRN
  Administered 2019-01-02: 100 mg via INTRAVENOUS

## 2019-01-02 MED ORDER — KETAMINE HCL 50 MG/ML IJ SOLN
INTRAMUSCULAR | Status: DC | PRN
  Administered 2019-01-02: 25 mg via INTRAVENOUS
  Administered 2019-01-02: 25 mg via INTRAMUSCULAR

## 2019-01-02 MED ORDER — FENTANYL CITRATE (PF) 250 MCG/5ML IJ SOLN
INTRAMUSCULAR | Status: AC
  Filled 2019-01-02: qty 5

## 2019-01-02 MED ORDER — PHENYLEPHRINE HCL (PRESSORS) 10 MG/ML IV SOLN
INTRAVENOUS | Status: DC | PRN
  Administered 2019-01-02 (×2): 100 ug via INTRAVENOUS

## 2019-01-02 MED ORDER — SODIUM CHLORIDE 0.9 % IV SOLN
INTRAVENOUS | Status: DC | PRN
  Administered 2019-01-02: 40 mL

## 2019-01-02 MED ORDER — DEXTROSE 5 % IV SOLN
3.0000 g | INTRAVENOUS | Status: AC
  Administered 2019-01-02: 3 g via INTRAVENOUS
  Filled 2019-01-02: qty 3

## 2019-01-02 MED ORDER — ONDANSETRON HCL 4 MG/2ML IJ SOLN
INTRAMUSCULAR | Status: DC | PRN
  Administered 2019-01-02: 4 mg via INTRAVENOUS

## 2019-01-02 MED ORDER — PROMETHAZINE HCL 25 MG/ML IJ SOLN: 6.2500 mg | INTRAMUSCULAR | Status: DC | PRN

## 2019-01-02 MED ORDER — GLYCOPYRROLATE 0.2 MG/ML IJ SOLN
INTRAMUSCULAR | Status: AC
  Filled 2019-01-02: qty 1

## 2019-01-02 MED ORDER — METHOCARBAMOL 500 MG PO TABS: 500.0000 mg | ORAL_TABLET | Freq: Four times a day (QID) | ORAL | 0 refills | Status: AC | PRN

## 2019-01-02 MED ORDER — ONDANSETRON HCL 4 MG/2ML IJ SOLN
INTRAMUSCULAR | Status: AC
  Filled 2019-01-02: qty 2

## 2019-01-02 MED ORDER — FENTANYL CITRATE (PF) 100 MCG/2ML IJ SOLN
INTRAMUSCULAR | Status: DC | PRN
  Administered 2019-01-02 (×4): 50 ug via INTRAVENOUS

## 2019-01-02 MED ORDER — GLYCOPYRROLATE 0.2 MG/ML IJ SOLN
INTRAMUSCULAR | Status: DC | PRN
  Administered 2019-01-02: 0.2 mg via INTRAVENOUS

## 2019-01-02 MED ORDER — LABETALOL HCL 5 MG/ML IV SOLN
5.0000 mg | INTRAVENOUS | Status: AC | PRN
  Administered 2019-01-02 (×4): 5 mg via INTRAVENOUS

## 2019-01-02 MED ORDER — PREDNISONE 10 MG PO TABS: 10.0000 mg | ORAL_TABLET | Freq: Four times a day (QID) | ORAL | 0 refills | Status: AC

## 2019-01-02 MED ORDER — DEXAMETHASONE SODIUM PHOSPHATE 10 MG/ML IJ SOLN
INTRAMUSCULAR | Status: AC
  Filled 2019-01-02: qty 1

## 2019-01-02 MED ORDER — BACITRACIN 50000 UNITS IM SOLR
INTRAMUSCULAR | Status: AC
  Filled 2019-01-02: qty 1

## 2019-01-02 MED ORDER — DEXMEDETOMIDINE HCL IN NACL 200 MCG/50ML IV SOLN
INTRAVENOUS | Status: DC | PRN
  Administered 2019-01-02 (×2): 12 ug via INTRAVENOUS

## 2019-01-02 MED ORDER — HYDROMORPHONE HCL 1 MG/ML IJ SOLN
0.2500 mg | INTRAMUSCULAR | Status: AC | PRN
  Administered 2019-01-02 (×8): 0.25 mg via INTRAVENOUS

## 2019-01-02 MED ORDER — BUPIVACAINE-EPINEPHRINE (PF) 0.5% -1:200000 IJ SOLN
INTRAMUSCULAR | Status: DC | PRN
  Administered 2019-01-02: 3 mL

## 2019-01-02 MED ORDER — METHYLPREDNISOLONE ACETATE 40 MG/ML IJ SUSP
INTRAMUSCULAR | Status: AC
  Filled 2019-01-02: qty 1

## 2019-01-02 MED ORDER — METHYLPREDNISOLONE ACETATE 40 MG/ML IJ SUSP
INTRAMUSCULAR | Status: DC | PRN
  Administered 2019-01-02: 40 mg

## 2019-01-02 MED ORDER — DEXAMETHASONE SODIUM PHOSPHATE 10 MG/ML IJ SOLN
INTRAMUSCULAR | Status: DC | PRN
  Administered 2019-01-02: 10 mg via INTRAVENOUS

## 2019-01-02 MED ORDER — EPINEPHRINE PF 1 MG/ML IJ SOLN
INTRAMUSCULAR | Status: AC
  Filled 2019-01-02: qty 1

## 2019-01-02 MED ORDER — LACTATED RINGERS IV SOLN
INTRAVENOUS | Status: DC | PRN
  Administered 2019-01-02: 12:00:00 via INTRAVENOUS

## 2019-01-02 MED ORDER — MIDAZOLAM HCL 2 MG/2ML IJ SOLN
INTRAMUSCULAR | Status: AC
  Filled 2019-01-02: qty 2

## 2019-01-02 MED ORDER — HEMOSTATIC AGENTS (NO CHARGE) OPTIME
TOPICAL | Status: DC | PRN
  Administered 2019-01-02: 1

## 2019-01-02 MED ORDER — THROMBIN 5000 UNITS EX SOLR
CUTANEOUS | Status: AC
  Filled 2019-01-02: qty 5000

## 2019-01-02 MED ORDER — LIDOCAINE HCL (CARDIAC) PF 100 MG/5ML IV SOSY
PREFILLED_SYRINGE | INTRAVENOUS | Status: DC | PRN
  Administered 2019-01-02: 50 mg via INTRAVENOUS

## 2019-01-02 MED ORDER — SODIUM CHLORIDE 0.9 % IV SOLN
INTRAVENOUS | Status: DC
  Administered 2019-01-02: 09:00:00 via INTRAVENOUS

## 2019-01-02 SURGICAL SUPPLY — 59 items
BUR NEURO DRILL SOFT 3.0X3.8M (BURR) ×3 IMPLANT
CANISTER SUCT 1200ML W/VALVE (MISCELLANEOUS) ×6 IMPLANT
CHLORAPREP W/TINT 26 (MISCELLANEOUS) ×6 IMPLANT
CNTNR SPEC 2.5X3XGRAD LEK (MISCELLANEOUS) ×1
CONT SPEC 4OZ STER OR WHT (MISCELLANEOUS) ×2
CONTAINER SPEC 2.5X3XGRAD LEK (MISCELLANEOUS) ×1 IMPLANT
COUNTER NEEDLE 20/40 LG (NEEDLE) ×3 IMPLANT
COVER LIGHT HANDLE STERIS (MISCELLANEOUS) ×6 IMPLANT
COVER WAND RF STERILE (DRAPES) ×3 IMPLANT
CUP MEDICINE 2OZ PLAST GRAD ST (MISCELLANEOUS) ×6 IMPLANT
DERMABOND ADVANCED (GAUZE/BANDAGES/DRESSINGS) ×2
DERMABOND ADVANCED .7 DNX12 (GAUZE/BANDAGES/DRESSINGS) ×1 IMPLANT
DRAPE C-ARM 42X72 X-RAY (DRAPES) ×6 IMPLANT
DRAPE LAPAROTOMY 100X77 ABD (DRAPES) ×3 IMPLANT
DRAPE MICROSCOPE SPINE 48X150 (DRAPES) ×3 IMPLANT
DRAPE POUCH INSTRU U-SHP 10X18 (DRAPES) ×3 IMPLANT
DRAPE SURG 17X11 SM STRL (DRAPES) ×12 IMPLANT
ELECT CAUTERY BLADE TIP 2.5 (TIP) ×3
ELECT EZSTD 165MM 6.5IN (MISCELLANEOUS)
ELECT REM PT RETURN 9FT ADLT (ELECTROSURGICAL) ×3
ELECTRODE CAUTERY BLDE TIP 2.5 (TIP) ×1 IMPLANT
ELECTRODE EZSTD 165MM 6.5IN (MISCELLANEOUS) IMPLANT
ELECTRODE REM PT RTRN 9FT ADLT (ELECTROSURGICAL) ×1 IMPLANT
FRAME EYE SHIELD (PROTECTIVE WEAR) ×6 IMPLANT
GLOVE BIOGEL PI IND STRL 7.0 (GLOVE) ×1 IMPLANT
GLOVE BIOGEL PI INDICATOR 7.0 (GLOVE) ×2
GLOVE SURG SYN 7.0 (GLOVE) ×6 IMPLANT
GLOVE SURG SYN 7.0 PF PI (GLOVE) ×2 IMPLANT
GLOVE SURG SYN 8.5  E (GLOVE) ×6
GLOVE SURG SYN 8.5 E (GLOVE) ×3 IMPLANT
GLOVE SURG SYN 8.5 PF PI (GLOVE) ×3 IMPLANT
GOWN SRG XL LVL 3 NONREINFORCE (GOWNS) ×1 IMPLANT
GOWN STRL NON-REIN TWL XL LVL3 (GOWNS) ×2
GOWN STRL REUS W/TWL MED LVL3 (GOWN DISPOSABLE) ×3 IMPLANT
GRADUATE 1200CC STRL 31836 (MISCELLANEOUS) ×3 IMPLANT
KIT SPINAL PRONEVIEW (KITS) ×3 IMPLANT
KNIFE BAYONET SHORT DISCETOMY (MISCELLANEOUS) IMPLANT
MARKER SKIN DUAL TIP RULER LAB (MISCELLANEOUS) ×3 IMPLANT
NDL SAFETY ECLIPSE 18X1.5 (NEEDLE) ×1 IMPLANT
NEEDLE HYPO 18GX1.5 SHARP (NEEDLE) ×2
NEEDLE HYPO 22GX1.5 SAFETY (NEEDLE) ×3 IMPLANT
NS IRRIG 1000ML POUR BTL (IV SOLUTION) ×3 IMPLANT
PACK LAMINECTOMY NEURO (CUSTOM PROCEDURE TRAY) ×3 IMPLANT
PAD ARMBOARD 7.5X6 YLW CONV (MISCELLANEOUS) ×3 IMPLANT
SPOGE SURGIFLO 8M (HEMOSTASIS) ×2
SPONGE SURGIFLO 8M (HEMOSTASIS) ×1 IMPLANT
SUT DVC VLOC 3-0 CL 6 P-12 (SUTURE) ×3 IMPLANT
SUT VIC AB 0 CT1 27 (SUTURE) ×2
SUT VIC AB 0 CT1 27XCR 8 STRN (SUTURE) ×1 IMPLANT
SUT VIC AB 2-0 CT1 18 (SUTURE) ×3 IMPLANT
SYR 10ML LL (SYRINGE) ×3 IMPLANT
SYR 20ML LL LF (SYRINGE) ×3 IMPLANT
SYR 30ML LL (SYRINGE) ×6 IMPLANT
SYR 3ML LL SCALE MARK (SYRINGE) ×3 IMPLANT
TOWEL OR 17X26 4PK STRL BLUE (TOWEL DISPOSABLE) ×9 IMPLANT
TUBE MATRX SPINL 18MM 7CM DISP (INSTRUMENTS) ×2
TUBE METRX SPINAL 18X7 DISP (INSTRUMENTS) IMPLANT
TUBING CONNECTING 10 (TUBING) ×2 IMPLANT
TUBING CONNECTING 10' (TUBING) ×1

## 2019-01-02 NOTE — Anesthesia Procedure Notes (Signed)
Procedure Name: Intubation Performed by: Rolla Plate, CRNA Pre-anesthesia Checklist: Patient identified, Patient being monitored, Timeout performed, Emergency Drugs available and Suction available Patient Re-evaluated:Patient Re-evaluated prior to induction Oxygen Delivery Method: Circle system utilized Preoxygenation: Pre-oxygenation with 100% oxygen Induction Type: IV induction and Rapid sequence Ventilation: Mask ventilation without difficulty Laryngoscope Size: 3, McGraph and 4 Grade View: Grade I Tube type: Oral Tube size: 7.5 mm Number of attempts: 1 Airway Equipment and Method: Stylet,  Video-laryngoscopy and LTA kit utilized Placement Confirmation: ETT inserted through vocal cords under direct vision,  positive ETCO2 and breath sounds checked- equal and bilateral Secured at: 23 cm Tube secured with: Tape Dental Injury: Teeth and Oropharynx as per pre-operative assessment

## 2019-01-02 NOTE — Transfer of Care (Signed)
Immediate Anesthesia Transfer of Care Note  Patient: Kyle Russell  Procedure(s) Performed: LUMBAR LAMINECTOMY/DECOMPRESSION MICRODISCECTOMY 1 LEVEL (Left Back)  Patient Location: PACU  Anesthesia Type:General  Level of Consciousness: sedated  Airway & Oxygen Therapy: Patient Spontanous Breathing and Patient connected to face mask oxygen  Post-op Assessment: Report given to RN and Post -op Vital signs reviewed and stable  Post vital signs: Reviewed  Last Vitals:  Vitals Value Taken Time  BP 175/116 01/02/19 1404  Temp    Pulse 101 01/02/19 1404  Resp 15 01/02/19 1404  SpO2 95 % 01/02/19 1404  Vitals shown include unvalidated device data.  Last Pain:  Vitals:   01/02/19 1056  TempSrc: Temporal  PainSc: 5       Patients Stated Pain Goal: 3 (45/03/88 8280)  Complications: No apparent anesthesia complications

## 2019-01-02 NOTE — Discharge Instructions (Signed)

## 2019-01-02 NOTE — TOC Transition Note (Signed)
Transition of Care [facility]) - CM/SW Discharge Note   Patient Details  Name: Kyle Russell MRN: [id number] Date of Birth: Jan 08, 1976  Transition of Care [facility]) CM/SW Contact:  Shelbie Hutching, RN Phone Number: 01/02/2019, 4:51 PM   Clinical Narrative:    Patient had a laminectomy today and is ready for discharge home.  Wife is at the bedside and will provide transportation home.  Patient reports he does not think that now since he has had the surgery that he will need home health services.  Patient made aware by [facility] that home health services can be arranged at a later time by his PCP if he gets home and decides that he could use some additional help.  Patient would like a rolling walker.  Rolling walker brought up to patient's room, provided by [facility].   No other discharge needs identified at this time.     Final next level of care: Home/Self Care Barriers to Discharge: Barriers Resolved, No Barriers Identified   Patient Goals and CMS Choice        Discharge Placement                       Discharge Plan and Services                DME Arranged: Walker rolling DME Agency: [employer] Date DME Agency Contacted: 01/02/19 Time DME Agency Contacted: 1540 Representative spoke with at DME Agency: [place] [facility] Interventions     Readmission Risk Interventions No flowsheet data found.

## 2019-01-02 NOTE — Anesthesia Preprocedure Evaluation (Signed)
Anesthesia Evaluation  Patient identified by MRN, date of birth, ID band Patient awake    Reviewed: Allergy & Precautions, H&P , NPO status , Patient's Chart, lab work & pertinent test results  Airway Mallampati: II  TM Distance: >3 FB Neck ROM: full    Dental  (+) Teeth Intact   Pulmonary neg pulmonary ROS,           Cardiovascular negative cardio ROS       Neuro/Psych negative neurological ROS  negative psych ROS   GI/Hepatic negative GI ROS, Neg liver ROS,   Endo/Other  negative endocrine ROS  Renal/GU      Musculoskeletal   Abdominal   Peds  Hematology negative hematology ROS (+)   Anesthesia Other Findings Obese, BMI 38  History reviewed. No pertinent past medical history.  Past Surgical History: 09/05/2014: BILATERAL CARPAL TUNNEL RELEASE; Bilateral     Comment:  Procedure: BILATERAL CARPAL TUNNEL RELEASE;  Surgeon:               Christophe Louis, MD;  Location: [facility] ORS;  Service:               Orthopedics;  Laterality: Bilateral; No date: NOSE SURGERY No date: TONSILLECTOMY  BMI    Body Mass Index: 37.30 kg/m      Reproductive/Obstetrics negative OB ROS                             Anesthesia Physical Anesthesia Plan  ASA: II  Anesthesia Plan: General ETT   Post-op Pain Management:    Induction:   PONV Risk Score and Plan: Ondansetron, Dexamethasone, Midazolam and Treatment may vary due to age or medical condition  Airway Management Planned:   Additional Equipment:   Intra-op Plan:   Post-operative Plan:   Informed Consent: I have reviewed the patients History and Physical, chart, labs and discussed the procedure including the risks, benefits and alternatives for the proposed anesthesia with the patient or authorized representative who has indicated his/her understanding and acceptance.     Dental Advisory Given  Plan Discussed with:  Anesthesiologist  Anesthesia Plan Comments: (Pt reports h/o facial swelling with hydrocodone, but has tolerated multiple doses of hydromorphone during this admission)        Anesthesia Quick Evaluation

## 2019-01-02 NOTE — Anesthesia Post-op Follow-up Note (Signed)
Anesthesia QCDR form completed.        

## 2019-01-02 NOTE — H&P (View-Only) (Signed)
Referring Physician:  No referring provider defined for this encounter.  Primary Physician:  Marguarite Arbour, MD  Chief Complaint:  Severe left leg pain, weakness  History of Present Illness: 01/02/2019 Kyle Russell is a 43 y.o. male who presents with the chief complaint of back and left leg pain.  This is well reviewed below.    He has undergone outpatient management but had worsening of symptoms and developed weakness in his left leg that prompted admission to the hospital.    He has been unable to walk or participate in substantial therapy.  Due to weakness and worsening of symptoms, he was advised by my partner that surgery could be considered.   He did not slight increase in effort for urination, though he is able to urinate.   01/01/2019 from Kyle Russell's note: [name] is admitted for severe back and left leg pain.  He states that he had the symptoms started approximately a year ago when he was being seen by an outside spine provider and was starting therapy with medications and the pain resolved.  He was scheduled for an injection but canceled it given the resolution of the symptoms.  He states that approximately 2 to 3 months ago the pain started recurring and will go down the lateral and posterior side of the left leg to the foot.  He does endorse some numbness on the bottom of her foot which will be intermittent with the pain.  He has noticed some weakness in his left leg and foot but is unsure if this is compensation for the pain.  3 days ago, he was doing some stretching at home when he noticed a sharp increase sensation in the left leg which prompted the emergency department evaluation.  Given his inability to stand and walk with the pain he was admitted and is currently undergoing a steroid taper.  He does state the pain is improved slightly.  He has not been able to get up and move around a significant amount.  He denies any right leg symptoms.  He does have an  injection scheduled as an outpatient next Monday.  Review of Systems:  A 10 point review of systems is negative, except for the pertinent positives and negatives detailed in the HPI.  Past Medical History: History reviewed. No pertinent past medical history.  Past Surgical History: Past Surgical History:  Procedure Laterality Date  . BILATERAL CARPAL TUNNEL RELEASE Bilateral 09/05/2014   Procedure: BILATERAL CARPAL TUNNEL RELEASE;  Surgeon: Myra Rude, MD;  Location: [facility] ORS;  Service: Orthopedics;  Laterality: Bilateral;  . NOSE SURGERY    . TONSILLECTOMY      Allergies: Allergies as of 12/30/2018 - Review Complete 12/30/2018  Allergen Reaction Noted  . Hydrocodone Swelling 09/04/2014  . Oxycodone Swelling 09/04/2014    Medications:  Current Facility-Administered Medications:  .  0.9 %  sodium chloride infusion, , Intravenous, Continuous, Sreenath, Sudheer B, MD .  acetaminophen (TYLENOL) tablet 650 mg, 650 mg, Oral, Q6H PRN **OR** acetaminophen (TYLENOL) suppository 650 mg, 650 mg, Rectal, Q6H PRN, Skip Mayer A, MD .  cyclobenzaprine (FLEXERIL) tablet 5 mg, 5 mg, Oral, TID PRN, Lurline Del, MD, 5 mg at 01/01/19 2004 .  HYDROmorphone (DILAUDID) injection 1 mg, 1 mg, Intravenous, Q2H PRN, Cuthriell, Jonathan D, PA-C, 1 mg at 01/01/19 0630 .  HYDROmorphone (DILAUDID) tablet 1 mg, 1 mg, Oral, Q4H PRN, Skip Mayer A, MD .  ondansetron (ZOFRAN) tablet 4 mg, 4 mg, Oral, Q6H  PRN **OR** ondansetron (ZOFRAN) injection 4 mg, 4 mg, Intravenous, Q6H PRN, Skip Mayerhomas, Sara-Maiz A, MD .  pantoprazole (PROTONIX) EC tablet 20 mg, 20 mg, Oral, Daily, Skip Mayerhomas, Sara-Maiz A, MD, 20 mg at 01/01/19 0913 .  predniSONE (DELTASONE) tablet 10 mg, 10 mg, Oral, 4X daily taper, Skip Mayerhomas, Sara-Maiz A, MD .  predniSONE (DELTASONE) tablet 20 mg, 20 mg, Oral, Nightly, Skip Mayerhomas, Sara-Maiz A, MD .  traMADol Janean Sark(ULTRAM) tablet 50 mg, 50 mg, Oral, Q6H PRN, Lurline Delhomas, Sara-Maiz A, MD   Social  History: Social History   Tobacco Use  . Smoking status: Never Smoker  . Smokeless tobacco: Never Used  Substance Use Topics  . Alcohol use: No  . Drug use: No    Family Medical History: History reviewed. No pertinent family history.  Physical Examination: Vitals:   01/01/19 1952 01/02/19 0401  BP: (!) 143/97 132/80  Pulse: 95 80  Resp: 15 15  Temp: 97.9 F (36.6 C) 97.7 F (36.5 C)  SpO2: 97% 96%     General: Patient is well developed, well nourished, calm, collected, and in no apparent distress.  Psychiatric: Patient is non-anxious.  Head:  Pupils equal, round, and reactive to light.  ENT:  Oral mucosa appears well hydrated.  Neck:   Supple.  Full range of motion.  Respiratory: Patient is breathing without any difficulty.  Extremities: No edema.  Vascular: Palpable pulses in dorsal pedal vessels.  Skin:   On exposed skin, there are no abnormal skin lesions.  NEUROLOGICAL:  General: In no acute distress.   Awake, alert, oriented to person, place, and time.  Pupils equal round and reactive to light.  Facial tone is symmetric.  Tongue protrusion is midline.  There is no pronator drift.  ROM of spine: full.  Palpation of spine: nontender.    Strength: Side Biceps Triceps Deltoid Interossei Grip Wrist Ext. Wrist Flex.  R 5 5 5 5 5 5 5   L 5 5 5 5 5 5 5    Side Iliopsoas Quads Hamstring PF DF EHL  R 5 5 5 5 5 5   L 5 5 5 5 4 4    Reflexes are 1+ and symmetric at the biceps, triceps, brachioradialis, patella and achilles.   Bilateral upper and lower extremity sensation is intact to light touch and pin prick.  Clonus is not present.  Toes are down-going.  Gait is abnormal and requires walker.  Hoffman's is absent.  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 This was seen on the prior exam and not significantly changed.  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   On: 12/30/2018 23:30  I have personally reviewed the images and agree with the above interpretation.  Labs: CBC Latest Ref Rng & Units 12/31/2018  WBC 4.0 - 10.5 K/uL 8.4  Hemoglobin 13.0 - 17.0 g/dL 13.015.4  Hematocrit 86.539.0 - 52.0 % 44.2  Platelets 150 - 400 K/uL 252       Assessment and Plan: Mr. Ivor CostaRoessler is a pleasant 43 y.o. 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  Due to worsening weakness and concern for urinary changes, I have  recommended left L4-5 microdiscectomy.  He has undergone 1 year of conservative management without improvement.  I discussed the planned procedure at length with the patient, including the risks, benefits, alternatives, and indications. The risks discussed include but are not limited to bleeding, infection, need for reoperation, spinal fluid leak, stroke, vision loss, anesthetic complication, coma, paralysis, and even death. I also described in detail that improvement was not guaranteed.  The patient expressed understanding of these risks, and asked that we proceed with surgery. I described the surgery in layman's terms, and gave ample opportunity for questions, which were answered to the best of my ability.  Cynia Abruzzo K. Izora Ribas MD, [facility]

## 2019-01-02 NOTE — Progress Notes (Signed)
D: Pt alert and oriented. Pt denies experiencing any pain at this time.   A: Pt and wife received discharge and medication education/information. Pt belongings were gathered and taken with pt upon discharge.   R: Pt and wife verbalized understanding of discharge and medication education/information.  Pt escorted to medical mall front lobby via wheelchair by staff where wife picked pt up.

## 2019-01-02 NOTE — Discharge Summary (Signed)
Physician Discharge Summary  Kyle Russell ZOX:[id number] DOB: 01/01/1976 DOA: 12/30/2018  PCP: Marguarite Arbour, MD  Admit date: 12/30/2018 Discharge date: 01/02/2019  Admitted From: Home Disposition:  Home  Recommendations for Outpatient Follow-up:  1. Follow up with PCP in 1-2 weeks 2. Please obtain BMP/CBC in one week 3. Please follow up on the following pending results:  Home Health:NO Equipment/Devices:NONE  Discharge Condition:Stable CODE STATUS:Full Diet recommendation: Regular  Brief/Interim Summary: 43 year old male with known chronic lower back pain x1 year followed as outpatient by orthopedics.  Present to the ED with acute on chronic intractable back pain.  Treated in the ED without significant relief.  Due to inability to ambulate and continued intractable back pain patient was admitted for observation to the hospitalist service  Patient able to ambulate with a walker.  Reluctant to use narcotic pain medication.  Reports minimal if any relief on current medication regimen.  Given the patient's lack of response to the multimodal pain regimen including.  Steroids followed by taper decision was made to involve neurosurgery.  Neurosurgical consultants on evaluate the patient and discuss potential surgical options.  Patient elected to proceed with L4-L5 microdiscectomy.  He underwent this procedure successfully on 01/02/2019.  No surgical complications were noted.  I communicated with the surgical service who cleared the patient from their standpoint for discharge on postoperative day #0.  I went and evaluated the patient at bedside answered any questions to the best of my ability.  Patient reported understanding of all discharge instructions.  Patient be discharged home.  He will follow up with neurosurgery within 2 weeks of discharge.  He will follow up with new primary care doctor in January 2020.   Discharge Diagnoses:  Active Problems:   Intractable low back pain  Severe low back pain   Bulging lumbar disc    Discharge Instructions  Discharge Instructions    Diet - low sodium heart healthy   Complete by: As directed    Increase activity slowly   Complete by: As directed      Allergies as of 01/02/2019      Reactions   Hydrocodone Swelling   Oxycodone Swelling      Medication List    TAKE these medications   acetaminophen 325 MG tablet Commonly known as: TYLENOL Take 650 mg by mouth every 6 (six) hours as needed.   gabapentin 300 MG capsule Commonly known as: NEURONTIN Take 1 capsule (300 mg total) by mouth at bedtime.   ibuprofen 200 MG tablet Commonly known as: ADVIL Take 200 mg by mouth every 6 (six) hours as needed.   Melatonin 2.5 MG Chew Chew by mouth.   methocarbamol 500 MG tablet Commonly known as: Robaxin Take 1 tablet (500 mg total) by mouth every 6 (six) hours as needed for muscle spasms.   predniSONE 10 MG tablet Commonly known as: DELTASONE Take 1 tablet (10 mg total) by mouth taper from 4 doses each day to 1 dose and stop.   traMADol 50 MG tablet Commonly known as: ULTRAM Take 1 tablet (50 mg total) by mouth every 4 (four) hours as needed. What changed:   how much to take  when to take this      Follow-up Information    Sparks, Duane Lope, MD.   Specialty: Internal Medicine Why: As needed Contact information: [address] [facility] [place] [address] [phone]        Venetia Night, MD. Schedule an appointment as soon as  possible for a visit on 01/17/2019.   Specialty: Neurosurgery Contact information: [address] [place] [address] [phone]          Allergies  Allergen Reactions  . Hydrocodone Swelling  . Oxycodone Swelling    Consultations:  Neurosurgery- Dr. Myer Haff   Procedures/Studies: Dg Thoracic Spine 2 View  Result Date: 12/30/2018 CLINICAL DATA:  Chronic back pain.  Left leg numbness. EXAM: THORACIC SPINE 2 VIEWS  COMPARISON:  None. FINDINGS: There is no evidence of thoracic spine fracture. Alignment is normal. No other significant bone abnormalities are identified. IMPRESSION: Negative. Electronically Signed   By: Charlett Nose M.D.   On: 12/30/2018 21:40   Dg Lumbar Spine 2-3 Views  Result Date: 01/02/2019 CLINICAL DATA:  Elective lumbar spine surgery. EXAM: LUMBAR SPINE - 2-3 VIEW; DG C-ARM 1-60 MIN COMPARISON:  12/30/2018 lumbar spine radiographs and MRI FLUOROSCOPY TIME:  Fluoroscopy Time:  0 minutes 5 seconds Number of Acquired Spot Images: 3 FINDINGS: Multiple nondiagnostic spot fluoroscopic intraoperative lumbar spine radiographs demonstrate posterior approach surgical marking device terminating over the soft tissues posterior to the L4-5 disc level. IMPRESSION: Intraoperative fluoroscopic guidance for lumbar spine surgery. Electronically Signed   By: Delbert Phenix M.D.   On: 01/02/2019 13:34   Dg Lumbar Spine 2-3 Views  Result Date: 12/30/2018 CLINICAL DATA:  Chronic back pain.  Left leg numbness. EXAM: LUMBAR SPINE - 2-3 VIEW COMPARISON:  None. FINDINGS: There is no evidence of lumbar spine fracture. Alignment is normal. Intervertebral disc spaces are maintained. IMPRESSION: Negative. Electronically Signed   By: Charlett Nose M.D.   On: 12/30/2018 21:40   Mr Thoracic Spine Wo Contrast  Result Date: 12/30/2018 CLINICAL DATA:  Rapidly progressive back pain, prior bulging disc at T11-T12 EXAM: MRI THORACIC SPINE WITHOUT CONTRAST TECHNIQUE: Multiplanar, multisequence MR imaging of the thoracic spine was performed. No intravenous contrast was administered. COMPARISON:  MRI July 10, 2018 FINDINGS: Alignment: Normal Vertebrae: Vertebral body heights are well maintained. No fracture, marrow edema, or pathologic infiltration. Cord: Normal in signal and morphology. Paraspinal and other soft tissues: Normal appearance to the paraspinal soft tissues and retroperitoneum. Disc levels: T1-T2: No significant canal or  neural foraminal narrowing T2-T3: No significant canal or neural foraminal narrowing T3-T4: No significant canal or neural foraminal narrowing T4-T5: No significant canal or neural foraminal narrowing T5-T6: No significant canal or neural foraminal narrowing T6-T7: No significant canal or neural foraminal narrowing T7-T8: There is a minimal broad-based disc bulge, however no significant canal or neural foraminal narrowing. T8-T9: There is a broad-based disc bulge with a right paracentral disc protrusion which causes mild effacement of the anterior thecal sac T9-T10: No significant canal or neural foraminal narrowing T10-T11: No significant canal or neural foraminal narrowing T11-T12: Again noted is a left paracentral disc protrusion/extrusion and measuring 8 mm in AP dimension which causes effacement of the anterior thecal sac which measures 6 mm in AP dimension. 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 Electronically Signed   By: Jonna Clark M.D.   On: 12/30/2018 23:24   Mr Lumbar Spine Wo Contrast  Result Date: 12/30/2018 CLINICAL DATA:  Back pain worsening over the last 6 weeks radiating into both lower extremities EXAM: MRI LUMBAR SPINE WITHOUT CONTRAST TECHNIQUE: Multiplanar, multisequence MR imaging of the lumbar spine was performed. No intravenous contrast was administered. COMPARISON:  July 10, 2018 FINDINGS: Segmentation: There are 5 non-rib bearing lumbar type vertebral bodies with  the last intervertebral disc space labeled as L5-S1. Alignment:  Normal Vertebrae: The vertebral body heights are well maintained. No fracture, marrow edema,or pathologic marrow infiltration. Fatty endplate changes are seen at L1, L3, and L4. Conus medullaris and cauda equina: Conus extends to the L1 level. Conus and cauda equina appear normal. Again noted is congenital shortening of the pedicles with a mildly narrowed  canal throughout. Paraspinal and other soft tissues: The paraspinal soft tissues and visualized retroperitoneal structures are unremarkable. The sacroiliac joints are intact. Disc levels: T12-L1:  No significant canal or neural foraminal narrowing. L1-L2:   No significant canal or neural foraminal narrowing. L2-L3: There is a minimal broad-based disc bulge with facet arthrosis which causes mild bilateral neural foraminal narrowing there is mild effacement anterior thecal sac. L3-L4: There is a broad-based disc bulge with facet arthrosis and ligamentum flavum hypertrophy which causes mild bilateral neural foraminal narrowing. The central thecal sac is mildly effaced measuring 8 mm in AP dimension. There is prominent epidural lipomatosis. L4-L5: Again noted is a left paracentral/lateral recess disc extrusion heading in the caudal direction measuring 7 mm in AP diameter. This disc extrusion contacts and impinges the descending left L5 nerve root. The central thecal sac is effaced measuring 4 mm in AP dimension. There is moderate bilateral neural foraminal narrowing. There is also prominent epidural lipomatosis. L5-S1: There is a broad-based disc bulge with facet arthrosis which causes moderate right and mild left neural foraminal narrowing. There is prominent epidural lipomatosis. The central thecal sac is effaced measuring 5 mm in AP dimension. 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 There is also severe central canal stenosis at this level. This was seen on the prior exam and not significantly changed. 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   On: 12/30/2018 23:30   Dg C-arm 1-60 Min  Result Date: 01/02/2019 CLINICAL DATA:  Elective lumbar spine surgery. EXAM: LUMBAR SPINE - 2-3 VIEW;  DG C-ARM 1-60 MIN COMPARISON:  12/30/2018 lumbar spine radiographs and MRI FLUOROSCOPY TIME:  Fluoroscopy Time:  0 minutes 5 seconds Number of Acquired Spot Images: 3 FINDINGS: Multiple nondiagnostic spot fluoroscopic intraoperative lumbar spine radiographs demonstrate posterior approach surgical marking device terminating over the soft tissues posterior to the L4-5 disc level. IMPRESSION: Intraoperative fluoroscopic guidance for lumbar spine surgery. Electronically Signed   By: Delbert PhenixJason A Poff M.D.   On: 01/02/2019 13:34       Subjective: Patient seen and examined on the day of discharge No new complaints, postoperative day 0 Cleared for discharge by neurosurgical service All questions answered to the best of my abilities   Discharge Exam: Vitals:   01/02/19 1548 01/02/19 1630  BP: (!) 160/101 (!) 139/93  Pulse:  82  Resp:  20  Temp:  97.7 F (36.5 C)  SpO2:  95%   Vitals:   01/02/19 1522 01/02/19 1532 01/02/19 1548 01/02/19 1630  BP: (!) 134/96 (!) 157/96 (!) 160/101 (!) 139/93  Pulse: 75 87  82  Resp: 15 12  20   Temp:    97.7 F (36.5 C)  TempSrc:    Oral  SpO2: 93% 93%  95%  Weight:      Height:        General: Pt is alert, awake, not in acute distress Cardiovascular: RRR, S1/S2 +, no rubs, no gallops Respiratory:  CTA bilaterally, no wheezing, no rhonchi Abdominal: Soft, NT, ND, bowel sounds + Extremities: no edema, no cyanosis    The results of significant diagnostics from this hospitalization (including imaging, microbiology, ancillary and laboratory) are listed below for reference.     Microbiology: Recent Results (from the past 240 hour(s))  SARS CORONAVIRUS 2 (TAT 6-24 HRS) Nasopharyngeal Nasopharyngeal Swab     Status: None   Collection Time: 12/31/18  1:47 AM   Specimen: Nasopharyngeal Swab  Result Value Ref Range Status   SARS Coronavirus 2 NEGATIVE NEGATIVE Final    Comment: (NOTE) SARS-CoV-2 target nucleic acids are NOT DETECTED. The SARS-CoV-2 RNA  is generally detectable in upper and lower respiratory specimens during the acute phase of infection. Negative results do not preclude SARS-CoV-2 infection, do not rule out co-infections with other pathogens, and should not be used as the sole basis for treatment or other patient management decisions. Negative results must be combined with clinical observations, patient history, and epidemiological information. The expected result is Negative. Fact Sheet for Patients: SugarRoll.be Fact Sheet for Healthcare Providers: https://www.woods-mathews.com/ This test is not yet approved or cleared by the [place] FDA and  has been authorized for detection and/or diagnosis of SARS-CoV-2 by FDA under an Emergency Use Authorization (EUA). This EUA will remain  in effect (meaning this test can be used) for the duration of the COVID-19 declaration under Section 56 4(b)(1) of the Act, 21 U.S.C. section 360bbb-3(b)(1), unless the authorization is terminated or revoked sooner. Performed at [facility], [place] [address], [place], [place] [address]   Surgical pcr screen     Status: None   Collection Time: 01/02/19  4:00 AM   Specimen: Nasal Mucosa; Nasal Swab  Result Value Ref Range Status   MRSA, PCR NEGATIVE NEGATIVE Final   Staphylococcus aureus NEGATIVE NEGATIVE Final    Comment: (NOTE) The Xpert SA Assay (FDA approved for NASAL specimens in patients 72 years of age and older), is one component of a comprehensive surveillance program. It is not intended to diagnose infection nor to guide or monitor treatment. Performed at [facility], [address] [place],  [address]      Labs: BNP (last 3 results) No results for input(s): BNP in the last 8760 hours. Basic Metabolic Panel: Recent Labs  Lab 12/31/18 0400 01/02/19 0826  NA 137 138  K 4.4 3.9  CL 101 103  CO2 26 26  GLUCOSE 119* 95  BUN 22* 48*  CREATININE 1.00  1.17  CALCIUM 9.6 9.4   Liver Function Tests: Recent Labs  Lab 12/31/18 0400  AST 26  ALT 23  ALKPHOS 46  BILITOT 1.0  PROT 7.5  ALBUMIN 4.4   No results for input(s): LIPASE, AMYLASE in the last 168 hours. No results for input(s): AMMONIA in the last 168 hours. CBC: Recent Labs  Lab 12/31/18 0400 01/02/19 0826  WBC 8.4 8.0  NEUTROABS  --  5.2  HGB 15.4 15.3  HCT 44.2 44.0  MCV 81.1 80.6  PLT 252 257   Cardiac Enzymes: No results for input(s): CKTOTAL, CKMB, CKMBINDEX, TROPONINI in the last 168 hours. BNP: Invalid input(s): POCBNP CBG: No results for input(s): GLUCAP in the last 168 hours. D-Dimer No results for input(s): DDIMER in the last 72 hours. Hgb A1c No results for input(s): HGBA1C in the last 72 hours. Lipid Profile No results for input(s): CHOL, HDL, LDLCALC, TRIG, CHOLHDL, LDLDIRECT in the last 72 hours. Thyroid function studies No results for  input(s): TSH, T4TOTAL, T3FREE, THYROIDAB in the last 72 hours.  Invalid input(s): FREET3 Anemia work up No results for input(s): VITAMINB12, FOLATE, FERRITIN, TIBC, IRON, RETICCTPCT in the last 72 hours. Urinalysis    Component Value Date/Time   COLORURINE YELLOW (A) 12/31/2018 0911   APPEARANCEUR CLEAR (A) 12/31/2018 0911   LABSPEC 1.018 12/31/2018 0911   PHURINE 5.0 12/31/2018 0911   GLUCOSEU NEGATIVE 12/31/2018 0911   HGBUR NEGATIVE 12/31/2018 0911   BILIRUBINUR NEGATIVE 12/31/2018 0911   KETONESUR 20 (A) 12/31/2018 0911   PROTEINUR NEGATIVE 12/31/2018 0911   NITRITE NEGATIVE 12/31/2018 0911   LEUKOCYTESUR NEGATIVE 12/31/2018 0911   Sepsis Labs Invalid input(s): PROCALCITONIN,  WBC,  LACTICIDVEN Microbiology Recent Results (from the past 240 hour(s))  SARS CORONAVIRUS 2 (TAT 6-24 HRS) Nasopharyngeal Nasopharyngeal Swab     Status: None   Collection Time: 12/31/18  1:47 AM   Specimen: Nasopharyngeal Swab  Result Value Ref Range Status   SARS Coronavirus 2 NEGATIVE NEGATIVE Final    Comment:  (NOTE) SARS-CoV-2 target nucleic acids are NOT DETECTED. The SARS-CoV-2 RNA is generally detectable in upper and lower respiratory specimens during the acute phase of infection. Negative results do not preclude SARS-CoV-2 infection, do not rule out co-infections with other pathogens, and should not be used as the sole basis for treatment or other patient management decisions. Negative results must be combined with clinical observations, patient history, and epidemiological information. The expected result is Negative. Fact Sheet for Patients: HairSlick.no Fact Sheet for Healthcare Providers: quierodirigir.com This test is not yet approved or cleared by the [place] FDA and  has been authorized for detection and/or diagnosis of SARS-CoV-2 by FDA under an Emergency Use Authorization (EUA). This EUA will remain  in effect (meaning this test can be used) for the duration of the COVID-19 declaration under Section 56 4(b)(1) of the Act, 21 U.S.C. section 360bbb-3(b)(1), unless the authorization is terminated or revoked sooner. Performed at [facility], [address] [place], [place] [address]   Surgical pcr screen     Status: None   Collection Time: 01/02/19  4:00 AM   Specimen: Nasal Mucosa; Nasal Swab  Result Value Ref Range Status   MRSA, PCR NEGATIVE NEGATIVE Final   Staphylococcus aureus NEGATIVE NEGATIVE Final    Comment: (NOTE) The Xpert SA Assay (FDA approved for NASAL specimens in patients 66 years of age and older), is one component of a comprehensive surveillance program. It is not intended to diagnose infection nor to guide or monitor treatment. Performed at [facility], [address], [place], [place] [address]      Time coordinating discharge: Over 30 minutes  SIGNED:   Tresa Moore, MD  [facility] 01/02/2019, 4:36 PM Pager [phone]  If 7PM-7AM, please contact  night-coverage [email or web address] Password [email or web address]

## 2019-01-02 NOTE — Op Note (Addendum)
Indications: [name] is a 43 yo male who presented with severe left leg pain and weakness that was worsening.  He was admitted for intractable pain, and was ultimately not able to ambulate without incapacitating pain.  Due to worsening pain and weakness, surgery was indicated and advised.  Findings: disc herniation L4-5  Preoperative Diagnosis: Lumbar radiculopathy Postoperative Diagnosis: same   EBL: 20 ml IVF: 400 ml Drains: none Disposition: Extubated and Stable to PACU Complications: none  No foley catheter was placed.   Preoperative Note:   Risks of surgery discussed include: infection, bleeding, stroke, coma, death, paralysis, CSF leak, nerve/spinal cord injury, numbness, tingling, weakness, complex regional pain syndrome, recurrent stenosis and/or disc herniation, vascular injury, development of instability, neck/back pain, need for further surgery, persistent symptoms, development of deformity, and the risks of anesthesia. The patient understood these risks and agreed to proceed.  Operative Note:   1) left L4/5 microdiscectomy  The patient was then brought from the preoperative center with intravenous access established.  The patient underwent general anesthesia and endotracheal tube intubation, and was then rotated on the [place] rail top where all pressure points were appropriately padded.  The skin was then thoroughly cleansed.  Perioperative antibiotic prophylaxis was administered.  Sterile prep and drapes were then applied and a timeout was then observed.  C-arm was brought into the field under sterile conditions, and the L4-5 disc space identified and marked with an incision on the left 1cm lateral to midline.  Once this was complete a 2 cm incision was opened with the use of a #10 blade knife.  The Metrx tubes were sequentially advanced under lateral fluoroscopy until a 18 x 70 mm Metrx tube was placed over the facet and lamina and secured to the bed.    The  microscope was then sterilely brought into the field and muscle creep was hemostased with a bipolar and resected with a pituitary rongeur.  A Bovie extender was then used to expose the spinous process and lamina.  Careful attention was placed to not violate the facet capsule. A 3 mm matchstick drill bit was then used to make a hemi-laminotomy trough until the ligamentum flavum was exposed.  This was extended to the base of the spinous process.  Once this was complete and the underlying ligamentum flavum was visualized this was dissected with an up angle curette and resected with a #2 and #3 mm biting Kerrison.  The laminotomy opening was also expanded in similar fashion and hemostasis was obtained with Surgifoam and a patty as well as bone wax.  The rostral aspect of the caudal level of the lamina was also resected with a #2 biting Kerrison effort to further enhance exposure.  Once the underlying dura was visualized a Penfield 4 was then used to dissect and expose the traversing nerve root.  Once this was identified a nerve root retractor suction was used to mobilize this medially.  The venous plexus was hemostased with Surgifoam and light bipolar use.  A small Penfield 4 was then used to make a small annulotomy within the disc space and disc space contents were noted to come through the annulus.    The disc herniation was identified and dissected free using a balltip probe. The pituitary rongeur was used to remove the extruded disc fragments. Once the thecal sac and nerve root were noted to be relaxed and under less tension the ball-tipped feeler was passed along the foramen distally to to ensure no residual compression was noted.  Depo-Medrol was placed along the nerve root.  The area was irrigated. The tube system was then removed under microscopic visualization and hemostasis was obtained with a bipolar.    The fascial layer was reapproximated with the use of a 0- Vicryl suture.  Subcutaneous tissue  layer was reapproximated using 2-0 Vicryl suture.  3-0 monocryl was used on the skin. The skin was then cleansed and Dermabond was used to close the skin opening.  Patient was then rotated back to the preoperative bed awakened from anesthesia and taken to recovery all counts are correct in this case.   I performed the entire procedure with the assistance of Kyle Olp PA as an Pensions consultant.  Kyle Maw MD

## 2019-01-02 NOTE — Progress Notes (Signed)
Procedure: Left L4-5 microdiscectomy Procedure date: 01/02/2019 Diagnosis: Lumbar radiculopathy   History: Kyle Russell is s/p left L4-5 microdiscectomy for lumbar radiculopathy  POD: Tolerated procedure well. Evaluated in post op recovery still disoriented from anesthesia but able to answer questions and obey commands.  Denies any lower extremity pain at this time.  Complains of back pain but unable to rated at this time.  Physical Exam: Vitals:   01/02/19 1417 01/02/19 1424  BP: (!) 167/110   Pulse: 99 100  Resp: 13 13  Temp:    SpO2: 100% 100%    General: Disoriented from anesthesia, laying in hospital bed. Strength:5/5 throughout lower extremities bilaterally Sensation: intact and symmetric throughout lower extremities bilaterally Skin: Glue intact at incision site  Data:  Recent Labs  Lab 12/31/18 0400 01/02/19 0826  NA 137 138  K 4.4 3.9  CL 101 103  CO2 26 26  BUN 22* 48*  CREATININE 1.00 1.17  GLUCOSE 119* 95  CALCIUM 9.6 9.4   Recent Labs  Lab 12/31/18 0400  AST 26  ALT 23  ALKPHOS 46     Recent Labs  Lab 12/31/18 0400 01/02/19 0826  WBC 8.4 8.0  HGB 15.4 15.3  HCT 44.2 44.0  PLT 252 257   Recent Labs  Lab 01/01/19 1701  APTT 28  INR 1.0         Other tests/results: No imaging reviewed  Assessment/Plan:  Kyle Russell is POD0 s/p left L4-5 microdiscectomy.  Complains of back pain but no complaints of left lower extremity pain at this time.  We will continue postop pain control with tramadol, muscle relaxer, and Tylenol.  He is scheduled to follow-up in clinic in approximately 2 weeks to monitor progress.  Marin Olp PA-C Department of Neurosurgery

## 2019-01-02 NOTE — Progress Notes (Signed)
Pharmacy consult for Ancef dosing  43 yo male  Weight= 124.7 kg  Will order Cefazolin 3 gram IV pre-op for weight > 120 kg  Chinita Greenland PharmD Clinical Pharmacist 01/02/2019

## 2019-01-02 NOTE — Progress Notes (Signed)
PROGRESS NOTE    Kyle Russell  TWS:[id number] DOB: 1975-11-10 DOA: 12/30/2018 PCP: Idelle Crouch, MD   Brief Narrative:  43 year old male with known chronic lower back pain x1 year followed as outpatient by orthopedics.  Present to the ED with acute on chronic intractable back pain.  Treated in the ED without significant relief.  Due to inability to ambulate and continued intractable back pain patient was admitted for observation to the hospitalist service  Patient able to ambulate with a walker.  Reluctant to use narcotic pain medication.  Reports minimal if any relief on current medication regimen.  12/1: NSG consulted.  Plan for microdiscectomy today.  Patient consented.   Assessment & Plan:   Active Problems:   Intractable low back pain   Severe low back pain   Bulging lumbar disc  Intractable back pain L4/L5 disc disease -Currently on prednisone taper with minimal relief -Continue muscle relaxants as needed -Continue multimodal pain regimen, avoid narcotics -PT consult placed, recommendations appreciated - NSG consulted, recommendations appreciated - Plan for OR TODAY 12/2 with Dr. Izora Ribas.  NPO for procedure.  All questions answered to the best of my ability  Stage II Obesity (BMI 37.3) Counsel patient Nutrition consult    DVT prophylaxis: SQH Code Status: FULL Family Communication: last conversation with wife at bedside 11/30 Disposition Plan: Home  Consultants:   Neurosurgery   Procedures: None  Antimicrobials: none  Subjective: Seen and examined No acute events Sitting up on edge of bed Continues to endorse back pain Minimal response to medication regimen Plan for OR with NSG today  Objective: Vitals:   01/01/19 0843 01/01/19 1952 01/02/19 0401 01/02/19 0842  BP: (!) 142/84 (!) 143/97 132/80 (!) 143/102  Pulse: 98 95 80 89  Resp: 16 15 15    Temp: 97.8 F (36.6 C) 97.9 F (36.6 C) 97.7 F (36.5 C) 97.9 F (36.6 C)  TempSrc: Oral  Oral Oral Oral  SpO2: 98% 97% 96% 100%  Weight:      Height:        Intake/Output Summary (Last 24 hours) at 01/02/2019 1040 Last data filed at 01/01/2019 1700 Gross per 24 hour  Intake 480 ml  Output -  Net 480 ml   Filed Weights   12/30/18 2038  Weight: 124.7 kg    Examination:  General exam: Appears calm and comfortable  Respiratory system: Clear to auscultation. Respiratory effort normal. Cardiovascular system: S1 & S2 heard, RRR. No JVD, murmurs, rubs, gallops or clicks. No pedal edema. Gastrointestinal system: Abdomen is nondistended, soft and nontender. No organomegaly or masses felt. Normal bowel sounds heard. Central nervous system: Alert and oriented. No focal neurological deficits. Extremities: Decreased strength bilateral lower extremities secondary to pain Skin: No rashes, lesions or ulcers Psychiatry: Judgement and insight appear normal. Mood & affect appropriate.     Data Reviewed: I have personally reviewed following labs and imaging studies  CBC: Recent Labs  Lab 12/31/18 0400 01/02/19 0826  WBC 8.4 8.0  NEUTROABS  --  5.2  HGB 15.4 15.3  HCT 44.2 44.0  MCV 81.1 80.6  PLT 252 001   Basic Metabolic Panel: Recent Labs  Lab 12/31/18 0400 01/02/19 0826  NA 137 138  K 4.4 3.9  CL 101 103  CO2 26 26  GLUCOSE 119* 95  BUN 22* 48*  CREATININE 1.00 1.17  CALCIUM 9.6 9.4   GFR: Estimated Creatinine Clearance: 113.3 mL/min (by C-G formula based on SCr of 1.17 mg/dL). Liver Function Tests: Recent  Labs  Lab 12/31/18 0400  AST 26  ALT 23  ALKPHOS 46  BILITOT 1.0  PROT 7.5  ALBUMIN 4.4   No results for input(s): LIPASE, AMYLASE in the last 168 hours. No results for input(s): AMMONIA in the last 168 hours. Coagulation Profile: Recent Labs  Lab 01/01/19 1701  INR 1.0   Cardiac Enzymes: No results for input(s): CKTOTAL, CKMB, CKMBINDEX, TROPONINI in the last 168 hours. BNP (last 3 results) No results for input(s): PROBNP in the last 8760  hours. HbA1C: No results for input(s): HGBA1C in the last 72 hours. CBG: No results for input(s): GLUCAP in the last 168 hours. Lipid Profile: No results for input(s): CHOL, HDL, LDLCALC, TRIG, CHOLHDL, LDLDIRECT in the last 72 hours. Thyroid Function Tests: No results for input(s): TSH, T4TOTAL, FREET4, T3FREE, THYROIDAB in the last 72 hours. Anemia Panel: No results for input(s): VITAMINB12, FOLATE, FERRITIN, TIBC, IRON, RETICCTPCT in the last 72 hours. Sepsis Labs: No results for input(s): PROCALCITON, LATICACIDVEN in the last 168 hours.  Recent Results (from the past 240 hour(s))  SARS CORONAVIRUS 2 (TAT 6-24 HRS) Nasopharyngeal Nasopharyngeal Swab     Status: None   Collection Time: 12/31/18  1:47 AM   Specimen: Nasopharyngeal Swab  Result Value Ref Range Status   SARS Coronavirus 2 NEGATIVE NEGATIVE Final    Comment: (NOTE) SARS-CoV-2 target nucleic acids are NOT DETECTED. The SARS-CoV-2 RNA is generally detectable in upper and lower respiratory specimens during the acute phase of infection. Negative results do not preclude SARS-CoV-2 infection, do not rule out co-infections with other pathogens, and should not be used as the sole basis for treatment or other patient management decisions. Negative results must be combined with clinical observations, patient history, and epidemiological information. The expected result is Negative. Fact Sheet for Patients: HairSlick.no Fact Sheet for Healthcare Providers: quierodirigir.com This test is not yet approved or cleared by the [place] FDA and  has been authorized for detection and/or diagnosis of SARS-CoV-2 by FDA under an Emergency Use Authorization (EUA). This EUA will remain  in effect (meaning this test can be used) for the duration of the COVID-19 declaration under Section 56 4(b)(1) of the Act, 21 U.S.C. section 360bbb-3(b)(1), unless the authorization is  terminated or revoked sooner. Performed at [facility], [address], [place], [place] [address]   Surgical pcr screen     Status: None   Collection Time: 01/02/19  4:00 AM   Specimen: Nasal Mucosa; Nasal Swab  Result Value Ref Range Status   MRSA, PCR NEGATIVE NEGATIVE Final   Staphylococcus aureus NEGATIVE NEGATIVE Final    Comment: (NOTE) The Xpert SA Assay (FDA approved for NASAL specimens in patients 52 years of age and older), is one component of a comprehensive surveillance program. It is not intended to diagnose infection nor to guide or monitor treatment. Performed at [facility], [address], [place], [place] [address]          Radiology Studies: No results found.      Scheduled Meds: . pantoprazole  20 mg Oral Daily  . predniSONE  10 mg Oral 4X daily taper  . predniSONE  20 mg Oral Nightly   Continuous Infusions: . sodium chloride 75 mL/hr at 01/02/19 0837     LOS: 2 days    Time spent: >30 minutes    Tresa Moore, MD [facility] Pager [phone]  If 7PM-7AM, please contact night-coverage [email or web address] Password [email or web address] 01/02/2019, 10:40 AM

## 2019-01-02 NOTE — Interval H&P Note (Signed)
History and Physical Interval Note:  01/02/2019 12:21 PM  Kyle Russell  has presented today for surgery, with the diagnosis of lumbar radiculopathy.  The various methods of treatment have been discussed with the patient and family. After consideration of risks, benefits and other options for treatment, the patient has consented to  Procedure(s): LUMBAR LAMINECTOMY/DECOMPRESSION MICRODISCECTOMY 1 LEVEL (Left) as a surgical intervention.  The patient's history has been reviewed, patient examined, no change in status, stable for surgery.  I have reviewed the patient's chart and labs.  Questions were answered to the patient's satisfaction.     Jillienne Egner   Heart sounds normal no MRG. Chest Clear to Auscultation Bilaterally.

## 2019-01-02 NOTE — Progress Notes (Signed)
Referring Physician:  No referring provider defined for this encounter.  Primary Physician:  Marguarite Arbour, MD  Chief Complaint:  Severe left leg pain, weakness  History of Present Illness: 01/02/2019 Kyle Russell is a 43 y.o. male who presents with the chief complaint of back and left leg pain.  This is well reviewed below.    He has undergone outpatient management but had worsening of symptoms and developed weakness in his left leg that prompted admission to the hospital.    He has been unable to walk or participate in substantial therapy.  Due to weakness and worsening of symptoms, he was advised by my partner that surgery could be considered.   He did not slight increase in effort for urination, though he is able to urinate.   01/01/2019 from Kyle Russell's note: Kyle Russell is admitted for severe back and left leg pain.  He states that he had the symptoms started approximately a year ago when he was being seen by an outside spine provider and was starting therapy with medications and the pain resolved.  He was scheduled for an injection but canceled it given the resolution of the symptoms.  He states that approximately 2 to 3 months ago the pain started recurring and will go down the lateral and posterior side of the left leg to the foot.  He does endorse some numbness on the bottom of her foot which will be intermittent with the pain.  He has noticed some weakness in his left leg and foot but is unsure if this is compensation for the pain.  3 days ago, he was doing some stretching at home when he noticed a sharp increase sensation in the left leg which prompted the emergency department evaluation.  Given his inability to stand and walk with the pain he was admitted and is currently undergoing a steroid taper.  He does state the pain is improved slightly.  He has not been able to get up and move around a significant amount.  He denies any right leg symptoms.  He does have an  injection scheduled as an outpatient next Monday.  Review of Systems:  A 10 point review of systems is negative, except for the pertinent positives and negatives detailed in the HPI.  Past Medical History: History reviewed. No pertinent past medical history.  Past Surgical History: Past Surgical History:  Procedure Laterality Date  . BILATERAL CARPAL TUNNEL RELEASE Bilateral 09/05/2014   Procedure: BILATERAL CARPAL TUNNEL RELEASE;  Surgeon: Myra Rude, MD;  Location: [facility] ORS;  Service: Orthopedics;  Laterality: Bilateral;  . NOSE SURGERY    . TONSILLECTOMY      Allergies: Allergies as of 12/30/2018 - Review Complete 12/30/2018  Allergen Reaction Noted  . Hydrocodone Swelling 09/04/2014  . Oxycodone Swelling 09/04/2014    Medications:  Current Facility-Administered Medications:  .  0.9 %  sodium chloride infusion, , Intravenous, Continuous, Sreenath, Sudheer B, MD .  acetaminophen (TYLENOL) tablet 650 mg, 650 mg, Oral, Q6H PRN **OR** acetaminophen (TYLENOL) suppository 650 mg, 650 mg, Rectal, Q6H PRN, Skip Mayer A, MD .  cyclobenzaprine (FLEXERIL) tablet 5 mg, 5 mg, Oral, TID PRN, Lurline Del, MD, 5 mg at 01/01/19 2004 .  HYDROmorphone (DILAUDID) injection 1 mg, 1 mg, Intravenous, Q2H PRN, Cuthriell, Jonathan D, PA-C, 1 mg at 01/01/19 0630 .  HYDROmorphone (DILAUDID) tablet 1 mg, 1 mg, Oral, Q4H PRN, Skip Mayer A, MD .  ondansetron (ZOFRAN) tablet 4 mg, 4 mg, Oral, Q6H  PRN **OR** ondansetron (ZOFRAN) injection 4 mg, 4 mg, Intravenous, Q6H PRN, Thomas, Sara-Maiz A, MD .  pantoprazole (PROTONIX) EC tablet 20 mg, 20 mg, Oral, Daily, Thomas, Sara-Maiz A, MD, 20 mg at 01/01/19 0913 .  predniSONE (DELTASONE) tablet 10 mg, 10 mg, Oral, 4X daily taper, Thomas, Sara-Maiz A, MD .  predniSONE (DELTASONE) tablet 20 mg, 20 mg, Oral, Nightly, Thomas, Sara-Maiz A, MD .  traMADol (ULTRAM) tablet 50 mg, 50 mg, Oral, Q6H PRN, Thomas, Sara-Maiz A, MD   Social  History: Social History   Tobacco Use  . Smoking status: Never Smoker  . Smokeless tobacco: Never Used  Substance Use Topics  . Alcohol use: No  . Drug use: No    Family Medical History: History reviewed. No pertinent family history.  Physical Examination: Vitals:   01/01/19 1952 01/02/19 0401  BP: (!) 143/97 132/80  Pulse: 95 80  Resp: 15 15  Temp: 97.9 F (36.6 C) 97.7 F (36.5 C)  SpO2: 97% 96%     General: Patient is well developed, well nourished, calm, collected, and in no apparent distress.  Psychiatric: Patient is non-anxious.  Head:  Pupils equal, round, and reactive to light.  ENT:  Oral mucosa appears well hydrated.  Neck:   Supple.  Full range of motion.  Respiratory: Patient is breathing without any difficulty.  Extremities: No edema.  Vascular: Palpable pulses in dorsal pedal vessels.  Skin:   On exposed skin, there are no abnormal skin lesions.  NEUROLOGICAL:  General: In no acute distress.   Awake, alert, oriented to person, place, and time.  Pupils equal round and reactive to light.  Facial tone is symmetric.  Tongue protrusion is midline.  There is no pronator drift.  ROM of spine: full.  Palpation of spine: nontender.    Strength: Side Biceps Triceps Deltoid Interossei Grip Wrist Ext. Wrist Flex.  R 5 5 5 5 5 5 5  L 5 5 5 5 5 5 5   Side Iliopsoas Quads Hamstring PF DF EHL  R 5 5 5 5 5 5  L 5 5 5 5 4 4   Reflexes are 1+ and symmetric at the biceps, triceps, brachioradialis, patella and achilles.   Bilateral upper and lower extremity sensation is intact to light touch and pin prick.  Clonus is not present.  Toes are down-going.  Gait is abnormal and requires walker.  Hoffman's is absent.  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 This was seen on the prior exam and not significantly changed.  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   On: 12/30/2018 23:30  I have personally reviewed the images and agree with the above interpretation.  Labs: CBC Latest Ref Rng & Units 12/31/2018  WBC 4.0 - 10.5 K/uL 8.4  Hemoglobin 13.0 - 17.0 g/dL 15.4  Hematocrit 39.0 - 52.0 % 44.2  Platelets 150 - 400 K/uL 252       Assessment and Plan: Mr. Speaker is a pleasant 43 y.o. 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  Due to worsening weakness and concern for urinary changes, I have   recommended left L4-5 microdiscectomy.  He has undergone 1 year of conservative management without improvement.  I discussed the planned procedure at length with the patient, including the risks, benefits, alternatives, and indications. The risks discussed include but are not limited to bleeding, infection, need for reoperation, spinal fluid leak, stroke, vision loss, anesthetic complication, coma, paralysis, and even death. I also described in detail that improvement was not guaranteed.  The patient expressed understanding of these risks, and asked that we proceed with surgery. I described the surgery in layman's terms, and gave ample opportunity for questions, which were answered to the best of my ability.  Myleka Moncure K. Izora Ribas MD, [facility]

## 2019-01-03 ENCOUNTER — Encounter: Payer: Self-pay | Admitting: Neurosurgery

## 2019-01-03 NOTE — Anesthesia Postprocedure Evaluation (Signed)
Anesthesia Post Note  Patient: [name]  Procedure(s) Performed: LUMBAR LAMINECTOMY/DECOMPRESSION MICRODISCECTOMY 1 LEVEL (Left Back)  Patient location during evaluation: PACU Anesthesia Type: General Level of consciousness: awake and alert Pain management: pain level controlled Vital Signs Assessment: post-procedure vital signs reviewed and stable Respiratory status: spontaneous breathing, nonlabored ventilation and respiratory function stable Cardiovascular status: blood pressure returned to baseline and stable Postop Assessment: no apparent nausea or vomiting Anesthetic complications: no     Last Vitals:  Vitals:   01/02/19 1548 01/02/19 1630  BP: (!) 160/101 (!) 139/93  Pulse:  82  Resp:  20  Temp:  36.5 C  SpO2:  95%    Last Pain:  Vitals:   01/02/19 1630  TempSrc: Oral  PainSc:                  Durenda Hurt

## 2019-01-07 ENCOUNTER — Inpatient Hospital Stay: Admit: 2019-01-07 | Payer: Managed Care, Other (non HMO)

## 2019-01-16 ENCOUNTER — Encounter: Payer: Managed Care, Other (non HMO) | Admitting: Physical Medicine and Rehabilitation

## 2019-01-22 ENCOUNTER — Encounter: Payer: Self-pay | Admitting: *Deleted

## 2019-09-27 ENCOUNTER — Ambulatory Visit: Payer: Managed Care, Other (non HMO) | Admitting: Physician Assistant

## 2021-09-14 IMAGING — MR MR LUMBAR SPINE W/O CM
5 series · 30 of 48 positions shown · non-contrast
Comparison: July 10, 2018

CLINICAL DATA: Back pain worsening over the last 6 weeks radiating
into both lower extremities

EXAM:
MRI LUMBAR SPINE WITHOUT CONTRAST
TECHNIQUE: Multiplanar, multisequence MR imaging of the lumbar spine was
performed. No intravenous contrast was administered.

[Series 6: T2 · sagittal · 4.0mm · 0.81mm/px · 6 of 17 slices shown (1 of 2)]
[im 1/17]
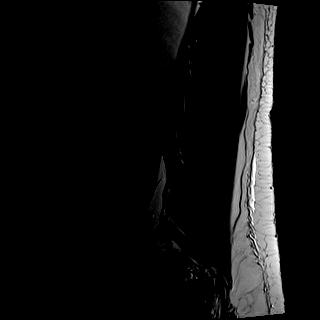
[im 4/17]
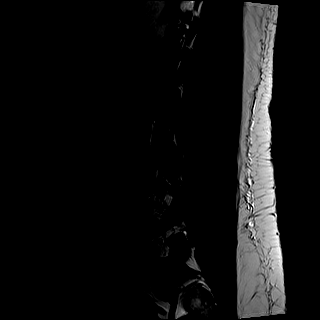
[im 7/17]
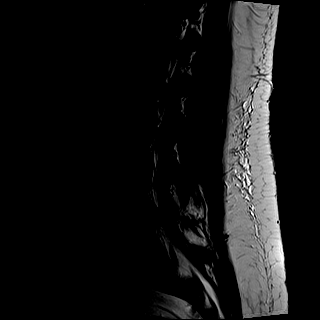
[im 10/17]
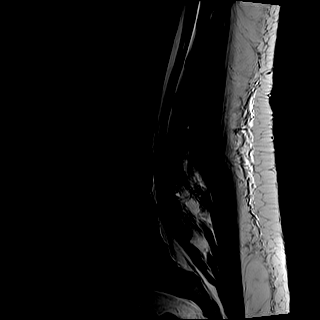
[im 13/17]
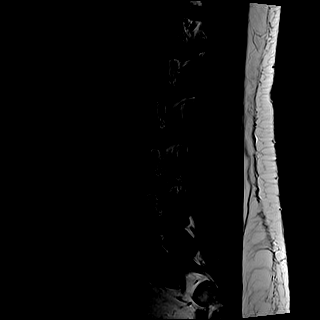
[im 17/17]
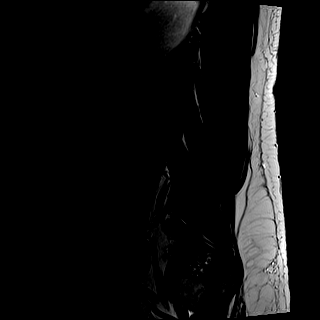

[Series 7: T1 · sagittal · 4.0mm · 0.81mm/px · 7 of 17 slices shown (1 of 2)]
[im 1/17]
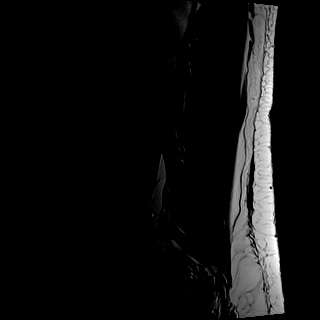
[im 3/17]
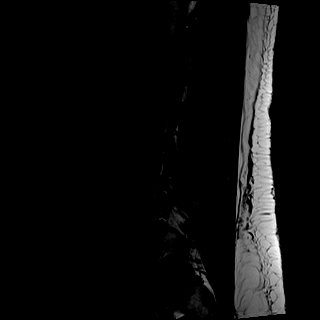
[im 6/17]
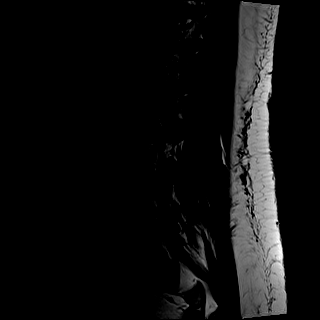
[im 9/17]
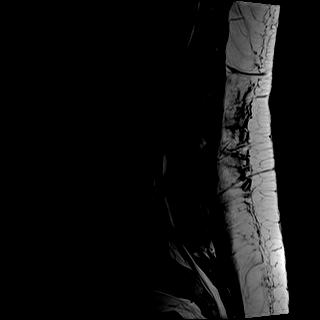
[im 11/17]
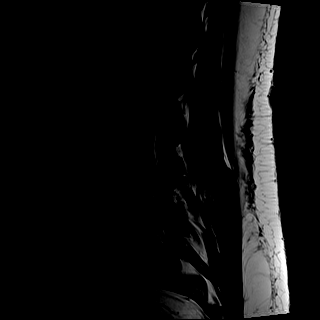
[im 14/17]
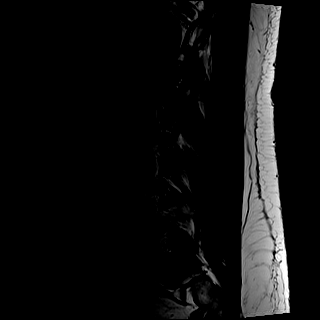
[im 17/17]
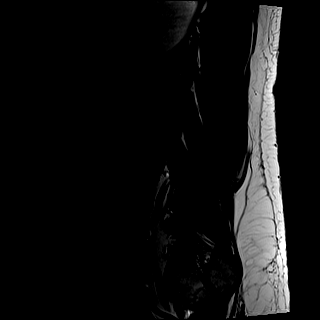

[Series 8: STIR · sagittal · 4.0mm · 0.41mm/px · 1 of 17 slices shown]
[im 1/17]
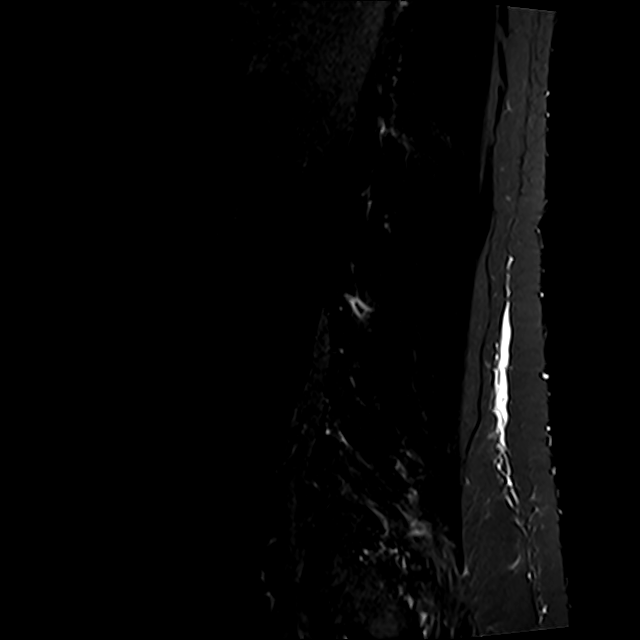

[Series 9: T2 · axial · 4.0mm · 0.78mm/px · z∈[-665,-462]mm · 8 of 35 slices shown (2 of 2)]
[im 1/35]
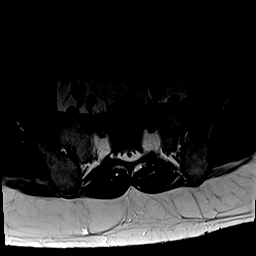
[im 6/35]
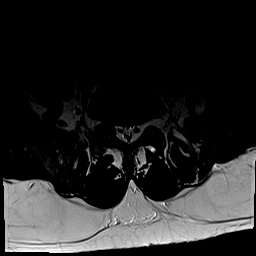
[im 11/35]
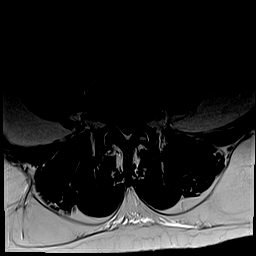
[im 16/35]
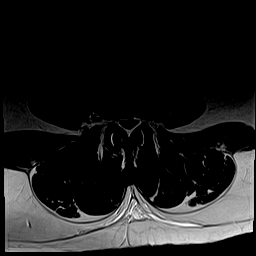
[im 19/35]
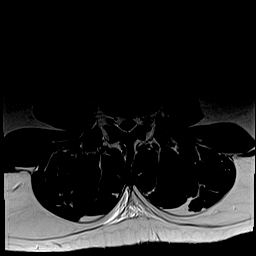
[im 24/35]
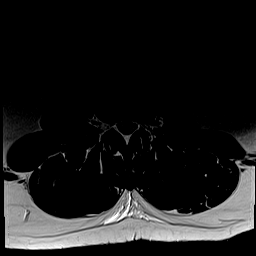
[im 29/35]
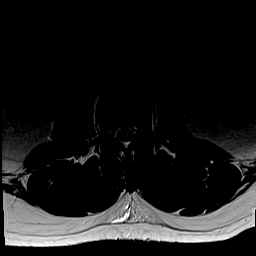
[im 35/35]
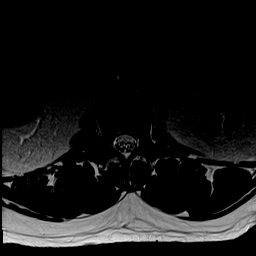

[Series 10: T1 · axial · 4.0mm · 0.39mm/px · z∈[-665,-462]mm · 8 of 35 slices shown (2 of 2)]
[im 1/35]
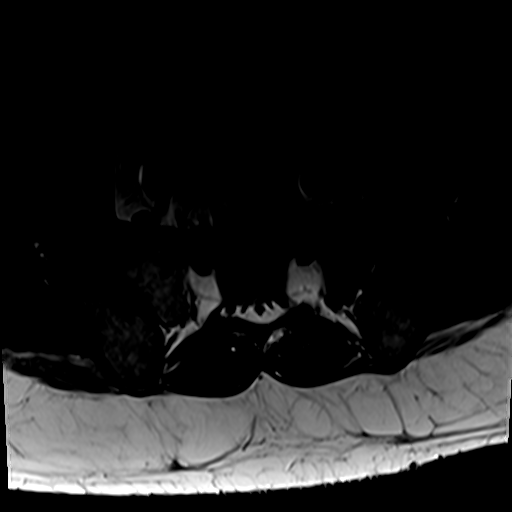
[im 6/35]
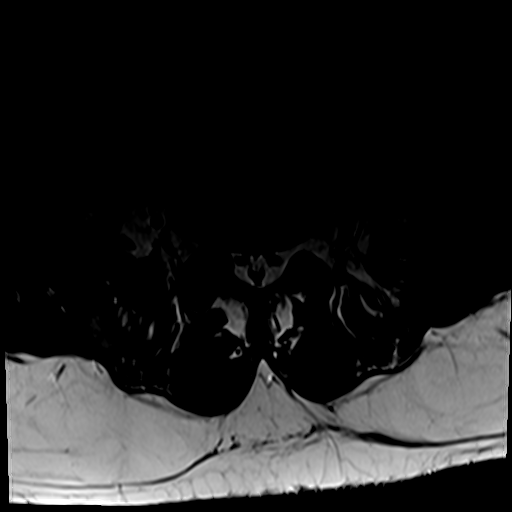
[im 11/35]
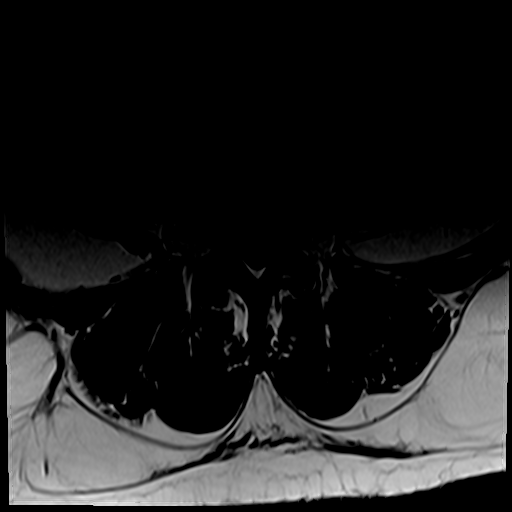
[im 16/35]
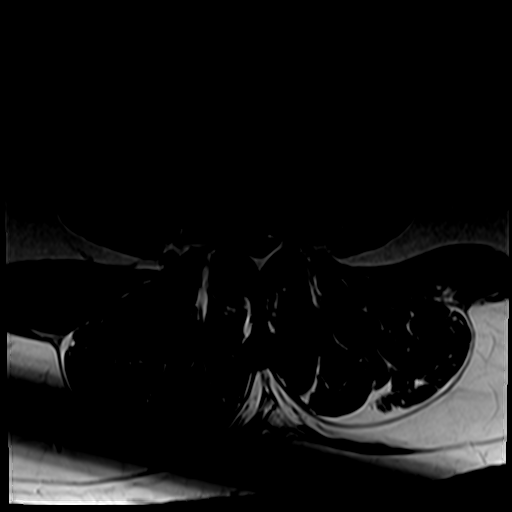
[im 19/35]
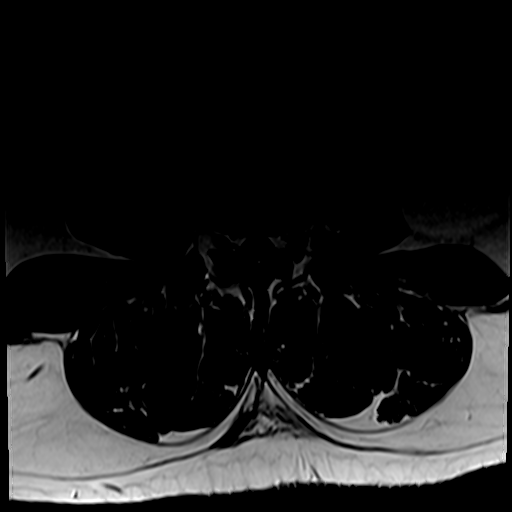
[im 24/35]
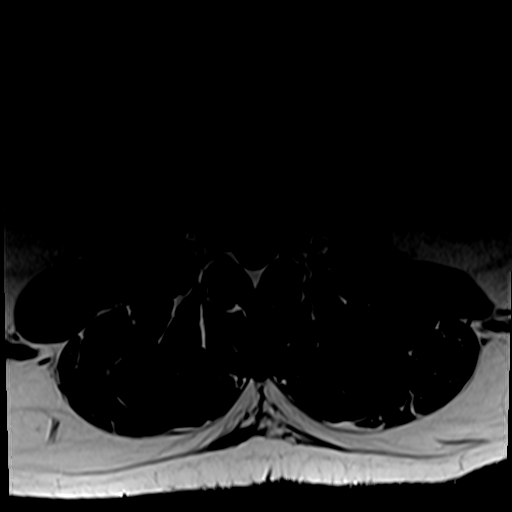
[im 29/35]
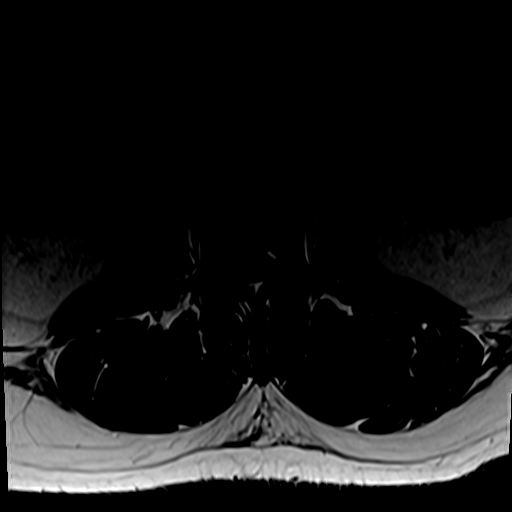
[im 35/35]
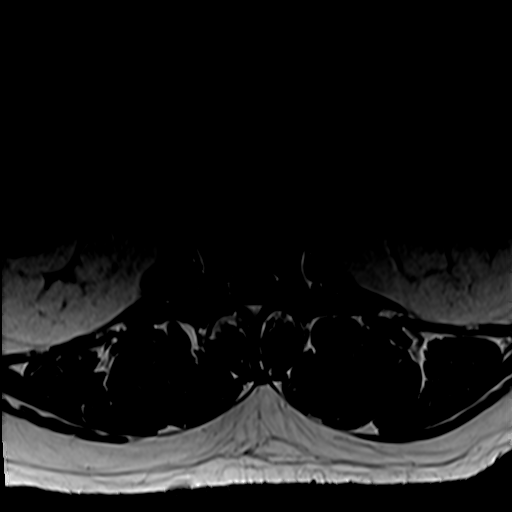

[30 of 48 positions shown; findings below may reference images not displayed]

FINDINGS: Segmentation: There are 5 non-rib bearing lumbar type vertebral
bodies with the last intervertebral disc space labeled as L5-S1.

Alignment:  Normal

Vertebrae: The vertebral body heights are well maintained. No
fracture, marrow edema,or pathologic marrow infiltration. Fatty
endplate changes are seen at L1, L3, and L4.

Conus medullaris and cauda equina: Conus extends to the L1 level.
Conus and cauda equina appear normal. Again noted is congenital
shortening of the pedicles with a mildly narrowed canal throughout.

Paraspinal and other soft tissues: The paraspinal soft tissues and
visualized retroperitoneal structures are unremarkable. The
sacroiliac joints are intact.

Disc levels:

T12-L1:  No significant canal or neural foraminal narrowing.

L1-L2:   No significant canal or neural foraminal narrowing.

L2-L3: There is a minimal broad-based disc bulge with facet
arthrosis which causes mild bilateral neural foraminal narrowing
there is mild effacement anterior thecal sac.

L3-L4: There is a broad-based disc bulge with facet arthrosis and
ligamentum flavum hypertrophy which causes mild bilateral neural
foraminal narrowing. The central thecal sac is mildly effaced
measuring 8 mm in AP dimension. There is prominent epidural
lipomatosis.

L4-L5: Again noted is a left paracentral/lateral recess disc
extrusion heading in the caudal direction measuring 7 mm in AP
diameter. This disc extrusion contacts and impinges the descending
left L5 nerve root. The central thecal sac is effaced measuring 4 mm
in AP dimension. There is moderate bilateral neural foraminal
narrowing. There is also prominent epidural lipomatosis.

L5-S1: There is a broad-based disc bulge with facet arthrosis which
causes moderate right and mild left neural foraminal narrowing.
There is prominent epidural lipomatosis. The central thecal sac is
effaced measuring 5 mm in AP dimension.
IMPRESSION: Lumbar spine spondylosis most notable at L4-L5 with a left
paracentral/lateral recess disc extrusion contacting and impinging
the descending left L5 nerve root. There is also severe central
canal stenosis at this level. This was seen on the prior exam and
not significantly changed.

Findings suggestive congenital shortening of the pedicles and
prominent epidural lipomatosis
